# Patient Record
Sex: Female | Born: 1990
Health system: Southern US, Community
[De-identification: ages and names within clinical notes are randomized; demographics above are authoritative.]

## PROBLEM LIST (undated history)

## (undated) DIAGNOSIS — E785 Hyperlipidemia, unspecified: Secondary | ICD-10-CM

## (undated) DIAGNOSIS — K219 Gastro-esophageal reflux disease without esophagitis: Secondary | ICD-10-CM

## (undated) DIAGNOSIS — F32A Depression, unspecified: Secondary | ICD-10-CM

## (undated) DIAGNOSIS — F329 Major depressive disorder, single episode, unspecified: Secondary | ICD-10-CM

## (undated) DIAGNOSIS — G43909 Migraine, unspecified, not intractable, without status migrainosus: Secondary | ICD-10-CM

## (undated) DIAGNOSIS — B019 Varicella without complication: Secondary | ICD-10-CM

## (undated) HISTORY — DX: Gastro-esophageal reflux disease without esophagitis: K21.9

## (undated) HISTORY — DX: Hyperlipidemia, unspecified: E78.5

## (undated) HISTORY — DX: Migraine, unspecified, not intractable, without status migrainosus: G43.909

## (undated) HISTORY — DX: Varicella without complication: B01.9

## (undated) HISTORY — DX: Major depressive disorder, single episode, unspecified: F32.9

## (undated) HISTORY — DX: Depression, unspecified: F32.A

---
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
## 2017-04-25 DIAGNOSIS — R197 Diarrhea, unspecified: Secondary | ICD-10-CM | POA: Diagnosis not present

## 2017-04-25 DIAGNOSIS — F419 Anxiety disorder, unspecified: Secondary | ICD-10-CM | POA: Diagnosis not present

## 2017-04-25 DIAGNOSIS — R1031 Right lower quadrant pain: Secondary | ICD-10-CM | POA: Diagnosis not present

## 2017-04-25 DIAGNOSIS — R1033 Periumbilical pain: Secondary | ICD-10-CM | POA: Diagnosis not present

## 2017-04-25 DIAGNOSIS — R630 Anorexia: Secondary | ICD-10-CM | POA: Diagnosis not present

## 2017-04-25 DIAGNOSIS — Z975 Presence of (intrauterine) contraceptive device: Secondary | ICD-10-CM | POA: Diagnosis not present

## 2017-04-25 DIAGNOSIS — R109 Unspecified abdominal pain: Secondary | ICD-10-CM | POA: Diagnosis not present

## 2017-04-25 DIAGNOSIS — R42 Dizziness and giddiness: Secondary | ICD-10-CM | POA: Diagnosis not present

## 2017-04-25 DIAGNOSIS — K219 Gastro-esophageal reflux disease without esophagitis: Secondary | ICD-10-CM | POA: Diagnosis not present

## 2017-04-25 DIAGNOSIS — Z79899 Other long term (current) drug therapy: Secondary | ICD-10-CM | POA: Diagnosis not present

## 2017-04-25 DIAGNOSIS — Z885 Allergy status to narcotic agent status: Secondary | ICD-10-CM | POA: Diagnosis not present

## 2017-04-25 DIAGNOSIS — R1110 Vomiting, unspecified: Secondary | ICD-10-CM | POA: Diagnosis not present

## 2017-04-25 DIAGNOSIS — F329 Major depressive disorder, single episode, unspecified: Secondary | ICD-10-CM | POA: Diagnosis not present

## 2017-04-25 DIAGNOSIS — Z888 Allergy status to other drugs, medicaments and biological substances status: Secondary | ICD-10-CM | POA: Diagnosis not present

## 2017-05-08 DIAGNOSIS — Z3201 Encounter for pregnancy test, result positive: Secondary | ICD-10-CM | POA: Diagnosis not present

## 2017-07-04 DIAGNOSIS — N9089 Other specified noninflammatory disorders of vulva and perineum: Secondary | ICD-10-CM | POA: Diagnosis not present

## 2017-07-04 DIAGNOSIS — N760 Acute vaginitis: Secondary | ICD-10-CM | POA: Diagnosis not present

## 2017-07-04 DIAGNOSIS — N941 Unspecified dyspareunia: Secondary | ICD-10-CM | POA: Diagnosis not present

## 2017-07-04 DIAGNOSIS — Z975 Presence of (intrauterine) contraceptive device: Secondary | ICD-10-CM | POA: Diagnosis not present

## 2017-07-04 DIAGNOSIS — A749 Chlamydial infection, unspecified: Secondary | ICD-10-CM | POA: Diagnosis not present

## 2017-08-23 DIAGNOSIS — J029 Acute pharyngitis, unspecified: Secondary | ICD-10-CM | POA: Diagnosis not present

## 2018-12-09 ENCOUNTER — Ambulatory Visit (INDEPENDENT_AMBULATORY_CARE_PROVIDER_SITE_OTHER): Payer: BLUE CROSS/BLUE SHIELD | Admitting: Family Medicine

## 2018-12-09 ENCOUNTER — Encounter: Payer: Self-pay | Admitting: Family Medicine

## 2018-12-09 VITALS — BP 120/70 | HR 65 | Ht 65.5 in | Wt 133.0 lb

## 2018-12-09 DIAGNOSIS — K580 Irritable bowel syndrome with diarrhea: Secondary | ICD-10-CM | POA: Insufficient documentation

## 2018-12-09 DIAGNOSIS — D352 Benign neoplasm of pituitary gland: Secondary | ICD-10-CM | POA: Diagnosis not present

## 2018-12-09 DIAGNOSIS — K219 Gastro-esophageal reflux disease without esophagitis: Secondary | ICD-10-CM | POA: Insufficient documentation

## 2018-12-09 DIAGNOSIS — E559 Vitamin D deficiency, unspecified: Secondary | ICD-10-CM | POA: Diagnosis not present

## 2018-12-09 DIAGNOSIS — F339 Major depressive disorder, recurrent, unspecified: Secondary | ICD-10-CM

## 2018-12-09 LAB — CBC
HCT: 38.8 % (ref 36.0–46.0)
Hemoglobin: 12.9 g/dL (ref 12.0–15.0)
MCHC: 33.2 g/dL (ref 30.0–36.0)
MCV: 86.9 fl (ref 78.0–100.0)
Platelets: 242 10*3/uL (ref 150.0–400.0)
RBC: 4.46 Mil/uL (ref 3.87–5.11)
RDW: 13.2 % (ref 11.5–15.5)
WBC: 4.9 10*3/uL (ref 4.0–10.5)

## 2018-12-09 LAB — TSH: TSH: 1.63 u[IU]/mL (ref 0.35–4.50)

## 2018-12-09 LAB — COMPREHENSIVE METABOLIC PANEL
ALT: 28 U/L (ref 0–35)
AST: 28 U/L (ref 0–37)
Albumin: 4.8 g/dL (ref 3.5–5.2)
Alkaline Phosphatase: 50 U/L (ref 39–117)
BUN: 8 mg/dL (ref 6–23)
CHLORIDE: 104 meq/L (ref 96–112)
CO2: 26 mEq/L (ref 19–32)
CREATININE: 0.58 mg/dL (ref 0.40–1.20)
Calcium: 9.9 mg/dL (ref 8.4–10.5)
GFR: 134.08 mL/min (ref >60.00)
GLUCOSE: 90 mg/dL (ref 70–99)
Potassium: 4.8 mEq/L (ref 3.5–5.1)
SODIUM: 138 meq/L (ref 135–145)
Total Bilirubin: 0.3 mg/dL (ref 0.2–1.2)
Total Protein: 7.3 g/dL (ref 6.0–8.3)

## 2018-12-09 LAB — VITAMIN D 25 HYDROXY (VIT D DEFICIENCY, FRACTURES): VITD: 19.38 ng/mL — ABNORMAL LOW (ref 30.00–100.00)

## 2018-12-09 MED ORDER — ESOMEPRAZOLE MAGNESIUM 20 MG PO PACK: 20.0000 mg | PACK | Freq: Every day | ORAL | 1 refills | Status: DC

## 2018-12-09 NOTE — Progress Notes (Addendum)
Established Patient Office Visit  Subjective:  Patient ID: Michele Foster, female    DOB: Feb 21, 1991  Age: 28 y.o. MRN: [id number]  CC:  Chief Complaint  Patient presents with  . Establish Care    HPI [name] presents for establishment of care and also to discuss a conglomeration of multiple symptoms.  Patient has a history of reflux that is been treated intermittently with Zantac and then OTC omeprazole.  She often has crampy abdominal pain after meals followed by loose stool.  There is been no blood or pus in her stooling.  She recently moved back to this area from [place].  She is currently living with her mother and sisters.  While in [place] she was told that she had vitamin D deficiency.  She took a supplement for a month.  She has a past medical history of pituitary microadenoma.  She has been lost to follow-up since 2010.  She currently has an IUD in place.  She is done well with this.  Normal Pap and pelvic this past year.  He is not currently sexually active.  She rarely drinks alcohol and does not smoke or use illicit drugs.  History of depression and anxiety in the past.  Past Medical History:  Diagnosis Date  . Chicken pox   . Depression   . GERD (gastroesophageal reflux disease)   . Hyperlipidemia   . Migraine     History reviewed. No pertinent surgical history.  Family History  Problem Relation Age of Onset  . Arthritis Mother   . Depression Mother   . Hearing loss Mother   . Mental illness Mother   . Hyperlipidemia Father   . Hypertension Father   . Depression Sister   . Mental illness Sister   . Arthritis Maternal Grandmother   . Cancer Maternal Grandmother   . Kidney disease Maternal Grandmother   . Hearing loss Maternal Grandfather   . Stroke Maternal Grandfather   . Depression Sister   . Hyperlipidemia Sister   . Mental illness Sister     Social History   Socioeconomic History  . Marital status: Single    Spouse name: Not on file  .  Number of children: Not on file  . Years of education: Not on file  . Highest education level: Not on file  Occupational History  . Not on file  Social Needs  . Financial resource strain: Not on file  . Food insecurity:    Worry: Not on file    Inability: Not on file  . Transportation needs:    Medical: Not on file    Non-medical: Not on file  Tobacco Use  . Smoking status: Never Smoker  . Smokeless tobacco: Never Used  Substance and Sexual Activity  . Alcohol use: Not Currently  . Drug use: Never  . Sexual activity: Yes  Lifestyle  . Physical activity:    Days per week: Not on file    Minutes per session: Not on file  . Stress: Not on file  Relationships  . Social connections:    Talks on phone: Not on file    Gets together: Not on file    Attends religious service: Not on file    Active member of club or organization: Not on file    Attends meetings of clubs or organizations: Not on file    Relationship status: Not on file  . Intimate partner violence:    Fear of current or ex partner: Not on file  Emotionally abused: Not on file    Physically abused: Not on file    Forced sexual activity: Not on file  Other Topics Concern  . Not on file  Social History Narrative  . Not on file    Outpatient Medications Prior to Visit  Medication Sig Dispense Refill  . omeprazole (PRILOSEC OTC) 20 MG tablet Take 20 mg by mouth daily.     No facility-administered medications prior to visit.     Allergies  Allergen Reactions  . Tramadol Nausea And Vomiting  . Ibuprofen Other (See Comments)    Intolerance.  Gastric upset   . Gabapentin     ROS Review of Systems  Constitutional: Negative for chills, diaphoresis, fatigue, fever and unexpected weight change.  HENT: Negative.   Eyes: Negative for photophobia and visual disturbance.  Respiratory: Negative.   Cardiovascular: Negative.   Gastrointestinal: Negative for anal bleeding, constipation, nausea, rectal pain and  vomiting.  Endocrine: Negative for polyphagia and polyuria.  Genitourinary: Negative.   Musculoskeletal: Negative.   Skin: Negative for rash and wound.  Allergic/Immunologic: Negative for immunocompromised state.  Neurological: Negative for seizures, speech difficulty, numbness and headaches.  Psychiatric/Behavioral: Positive for dysphoric mood. The patient is nervous/anxious.      Depression screen PHQ 2/9 12/09/2018  Decreased Interest 2  Down, Depressed, Hopeless 2  PHQ - 2 Score 4  Altered sleeping 3  Tired, decreased energy 3  Change in appetite 3  Feeling bad or failure about yourself  2  Trouble concentrating 3  Moving slowly or fidgety/restless 2  Suicidal thoughts 0  PHQ-9 Score 20      Objective:    Physical Exam  Constitutional: She is oriented to person, place, and time. She appears well-developed and well-nourished. No distress.  HENT:  Head: Normocephalic and atraumatic.  Right Ear: External ear normal.  Left Ear: External ear normal.  Mouth/Throat: Oropharynx is clear and moist. No oropharyngeal exudate.  Eyes: Pupils are equal, round, and reactive to light. Conjunctivae are normal. Right eye exhibits no discharge. Left eye exhibits no discharge. No scleral icterus.  Neck: Normal range of motion. Neck supple. No JVD present. No tracheal deviation present. No thyromegaly present.  Cardiovascular: Regular rhythm and normal heart sounds.  Pulmonary/Chest: Effort normal and breath sounds normal.  Abdominal: Soft. Bowel sounds are normal. She exhibits no distension. There is no abdominal tenderness. There is no rebound and no guarding.  Lymphadenopathy:    She has no cervical adenopathy.  Neurological: She is alert and oriented to person, place, and time.  Skin: Skin is warm and dry. She is not diaphoretic.  Psychiatric: She has a normal mood and affect. Her behavior is normal.    BP 120/70   Pulse 65   Ht 5' 5.5" (1.664 m)   Wt 133 lb (60.3 kg)   SpO2 97%    BMI 21.80 kg/m  Wt Readings from Last 3 Encounters:  12/09/18 133 lb (60.3 kg)     Health Maintenance Due  Topic Date Due  . HIV Screening  06/08/2008  . INFLUENZA VACCINE  07/04/2018    There are no preventive care reminders to display for this patient.  Lab Results  Component Value Date   TSH 1.63 12/09/2018   Lab Results  Component Value Date   WBC 4.9 12/09/2018   HGB 12.9 12/09/2018   HCT 38.8 12/09/2018   MCV 86.9 12/09/2018   PLT 242.0 12/09/2018   Lab Results  Component Value Date   NA  138 12/09/2018   K 4.8 12/09/2018   CO2 26 12/09/2018   GLUCOSE 90 12/09/2018   BUN 8 12/09/2018   CREATININE 0.58 12/09/2018   BILITOT 0.3 12/09/2018   ALKPHOS 50 12/09/2018   AST 28 12/09/2018   ALT 28 12/09/2018   PROT 7.3 12/09/2018   ALBUMIN 4.8 12/09/2018   CALCIUM 9.9 12/09/2018   GFR 134.08 12/09/2018   No results found for: CHOL No results found for: HDL No results found for: LDLCALC No results found for: TRIG No results found for: CHOLHDL No results found for: HGBA1C    Assessment & Plan:   Problem List Items Addressed This Visit      Digestive   Irritable bowel syndrome with diarrhea - Primary   Relevant Medications   omeprazole (PRILOSEC OTC) 20 MG tablet   esomeprazole (NEXIUM) 20 MG packet   Other Relevant Orders   CBC (Completed)   Comprehensive metabolic panel (Completed)   Gastroesophageal reflux disease   Relevant Medications   omeprazole (PRILOSEC OTC) 20 MG tablet   esomeprazole (NEXIUM) 20 MG packet     Endocrine   Pituitary microadenoma ([place])   Relevant Orders   TSH (Completed)   Prolactin (Completed)   Ambulatory referral to Endocrinology   Cortisol     Other   Vitamin D deficiency   Relevant Orders   VITAMIN D 25 Hydroxy (Vit-D Deficiency, Fractures) (Completed)   Recurrent major depressive disorder ([place])   Relevant Orders   Ambulatory referral to Psychology      Meds ordered this encounter  Medications  .  esomeprazole (NEXIUM) 20 MG packet    Sig: Take 20 mg by mouth daily before breakfast.    Dispense:  90 each    Refill:  1  . DISCONTD: Vitamin D, Ergocalciferol, (DRISDOL) 1.25 MG (50000 UT) CAPS capsule    Sig: Take 1 capsule (50,000 Units total) by mouth every 7 (seven) days.    Dispense:  15 capsule    Refill:  1    Follow-up: Return in about 3 months (around 03/10/2019), or if symptoms worsen or fail to improve.   Patient presents with a remarkably positive PHQ 9.  Advised her to start a medication.  She is adamant today about not using medicine at this time but does agree to pursue counseling.  Will check vitamin D and TSH that TSH levels.  Prolactin follow-up for history of microadenoma.  Will consider endocrinology consultation.

## 2018-12-09 NOTE — Patient Instructions (Signed)
Gastroesophageal Reflux Disease, Adult Gastroesophageal reflux (GER) happens when acid from the stomach flows up into the tube that connects the mouth and the stomach (esophagus). Normally, food travels down the esophagus and stays in the stomach to be digested. With GER, food and stomach acid sometimes move back up into the esophagus. You may have a disease called gastroesophageal reflux disease (GERD) if the reflux:  Happens often.  Causes frequent or very bad symptoms.  Causes problems such as damage to the esophagus. When this happens, the esophagus becomes sore and swollen (inflamed). Over time, GERD can make small holes (ulcers) in the lining of the esophagus. What are the causes? This condition is caused by a problem with the muscle between the esophagus and the stomach. When this muscle is weak or not normal, it does not close properly to keep food and acid from coming back up from the stomach. The muscle can be weak because of:  Tobacco use.  Pregnancy.  Having a certain type of hernia (hiatal hernia).  Alcohol use.  Certain foods and drinks, such as coffee, chocolate, onions, and peppermint. What increases the risk? You are more likely to develop this condition if you:  Are overweight.  Have a disease that affects your connective tissue.  Use NSAID medicines. What are the signs or symptoms? Symptoms of this condition include:  Heartburn.  Difficult or painful swallowing.  The feeling of having a lump in the throat.  A bitter taste in the mouth.  Bad breath.  Having a lot of saliva.  Having an upset or bloated stomach.  Belching.  Chest pain. Different conditions can cause chest pain. Make sure you see your doctor if you have chest pain.  Shortness of breath or noisy breathing (wheezing).  Ongoing (chronic) cough or a cough at night.  Wearing away of the surface of teeth (tooth enamel).  Weight loss. How is this treated? Treatment will depend on how  bad your symptoms are. Your doctor may suggest:  Changes to your diet.  Medicine.  Surgery. Follow these instructions at home: Eating and drinking   Follow a diet as told by your doctor. You may need to avoid foods and drinks such as: ? Coffee and tea (with or without caffeine). ? Drinks that contain alcohol. ? Energy drinks and sports drinks. ? Bubbly (carbonated) drinks or sodas. ? Chocolate and cocoa. ? Peppermint and mint flavorings. ? Garlic and onions. ? Horseradish. ? Spicy and acidic foods. These include peppers, chili powder, curry powder, vinegar, hot sauces, and BBQ sauce. ? Citrus fruit juices and citrus fruits, such as oranges, lemons, and limes. ? Tomato-based foods. These include red sauce, chili, salsa, and pizza with red sauce. ? Fried and fatty foods. These include donuts, french fries, potato chips, and high-fat dressings. ? High-fat meats. These include hot dogs, rib eye steak, sausage, ham, and bacon. ? High-fat dairy items, such as whole milk, butter, and cream cheese.  Eat small meals often. Avoid eating large meals.  Avoid drinking large amounts of liquid with your meals.  Avoid eating meals during the 2-3 hours before bedtime.  Avoid lying down right after you eat.  Do not exercise right after you eat. Lifestyle   Do not use any products that contain nicotine or tobacco. These include cigarettes, e-cigarettes, and chewing tobacco. If you need help quitting, ask your doctor.  Try to lower your stress. If you need help doing this, ask your doctor.  If you are overweight, lose an amount   of weight that is healthy for you. Ask your doctor about a safe weight loss goal. General instructions  Pay attention to any changes in your symptoms.  Take over-the-counter and prescription medicines only as told by your doctor. Do not take aspirin, ibuprofen, or other NSAIDs unless your doctor says it is okay.  Wear loose clothes. Do not wear anything tight  around your waist.  Raise (elevate) the head of your bed about 6 inches (15 cm).  Avoid bending over if this makes your symptoms worse.  Keep all follow-up visits as told by your doctor. This is important. Contact a doctor if:  You have new symptoms.  You lose weight and you do not know why.  You have trouble swallowing or it hurts to swallow.  You have wheezing or a cough that keeps happening.  Your symptoms do not get better with treatment.  You have a hoarse voice. Get help right away if:  You have pain in your arms, neck, jaw, teeth, or back.  You feel sweaty, dizzy, or light-headed.  You have chest pain or shortness of breath.  You throw up (vomit) and your throw-up looks like blood or coffee grounds.  You pass out (faint).  Your poop (stool) is bloody or black.  You cannot swallow, drink, or eat. Summary  If a person has gastroesophageal reflux disease (GERD), food and stomach acid move back up into the esophagus and cause symptoms or problems such as damage to the esophagus.  Treatment will depend on how bad your symptoms are.  Follow a diet as told by your doctor.  Take all medicines only as told by your doctor. This information is not intended to replace advice given to you by your health care provider. Make sure you discuss any questions you have with your health care provider. Document Released: 05/08/2008 Document Revised: 05/29/2018 Document Reviewed: 05/29/2018 Elsevier Interactive Patient Education  2019 [employer].  Major Depressive Disorder, Adult Major depressive disorder (MDD) is a mental health condition. It may also be called clinical depression or unipolar depression. MDD usually causes feelings of sadness, hopelessness, or helplessness. MDD can also cause physical symptoms. It can interfere with work, school, relationships, and other everyday activities. MDD may be mild, moderate, or severe. It may occur once (single episode major depressive  disorder) or it may occur multiple times (recurrent major depressive disorder). What are the causes? The exact cause of this condition is not known. MDD is most likely caused by a combination of things, which may include:  Genetic factors. These are traits that are passed along from parent to child.  Individual factors. Your personality, your behavior, and the way you handle your thoughts and feelings may contribute to MDD. This includes personality traits and behaviors learned from others.  Physical factors, such as: ? Differences in the part of your brain that controls emotion. This part of your brain may be different than it is in people who do not have MDD. ? Long-term (chronic) medical or psychiatric illnesses.  Social factors. Traumatic experiences or major life changes may play a role in the development of MDD. What increases the risk? This condition is more likely to develop in women. The following factors may also make you more likely to develop MDD:  A family history of depression.  Troubled family relationships.  Abnormally low levels of certain brain chemicals.  Traumatic events in childhood, especially abuse or the loss of a parent.  Being under a lot of stress, or long-term  stress, especially from upsetting life experiences or losses.  A history of: ? Chronic physical illness. ? Other mental health disorders. ? Substance abuse.  Poor living conditions.  Experiencing social exclusion or discrimination on a regular basis. What are the signs or symptoms? The main symptoms of MDD typically include:  Constant depressed or irritable mood.  Loss of interest in things and activities. MDD symptoms may also include:  Sleeping or eating too much or too little.  Unexplained weight change.  Fatigue or low energy.  Feelings of worthlessness or guilt.  Difficulty thinking clearly or making decisions.  Thoughts of suicide or of harming others.  Physical agitation  or weakness.  Isolation. Severe cases of MDD may also occur with other symptoms, such as:  Delusions or hallucinations, in which you imagine things that are not real (psychotic depression).  Low-level depression that lasts at least a year (chronic depression or persistent depressive disorder).  Extreme sadness and hopelessness (melancholic depression).  Trouble speaking and moving (catatonic depression). How is this diagnosed? This condition may be diagnosed based on:  Your symptoms.  Your medical history, including your mental health history. This may involve tests to evaluate your mental health. You may be asked questions about your lifestyle, including any drug and alcohol use, and how long you have had symptoms of MDD.  A physical exam.  Blood tests to rule out other conditions. You must have a depressed mood and at least four other MDD symptoms most of the day, nearly every day in the same 2-week timeframe before your health care provider can confirm a diagnosis of MDD. How is this treated? This condition is usually treated by mental health professionals, such as psychologists, psychiatrists, and clinical social workers. You may need more than one type of treatment. Treatment may include:  Psychotherapy. This is also called talk therapy or counseling. Types of psychotherapy include: ? Cognitive behavioral therapy (CBT). This type of therapy teaches you to recognize unhealthy feelings, thoughts, and behaviors, and replace them with positive thoughts and actions. ? Interpersonal therapy (IPT). This helps you to improve the way you relate to and communicate with others. ? Family therapy. This treatment includes members of your family.  Medicine to treat anxiety and depression, or to help you control certain emotions and behaviors.  Lifestyle changes, such as: ? Limiting alcohol and drug use. ? Exercising regularly. ? Getting plenty of sleep. ? Making healthy eating  choices. ? Spending more time outdoors.  Treatments involving stimulation of the brain can be used in situations with extremely severe symptoms, or when medicine or other therapies do not work over time. These treatments include electroconvulsive therapy, transcranial magnetic stimulation, and vagal nerve stimulation. Follow these instructions at home: Activity  Return to your normal activities as told by your health care provider.  Exercise regularly and spend time outdoors as told by your health care provider. General instructions  Take over-the-counter and prescription medicines only as told by your health care provider.  Do not drink alcohol. If you drink alcohol, limit your alcohol intake to no more than 1 drink a day for nonpregnant women and 2 drinks a day for men. One drink equals 12 oz of beer, 5 oz of wine, or 1 oz of hard liquor. Alcohol can affect any antidepressant medicines you are taking. Talk to your health care provider about your alcohol use.  Eat a healthy diet and get plenty of sleep.  Find activities that you enjoy doing, and make time to do  them.  Consider joining a support group. Your health care provider may be able to recommend a support group.  Keep all follow-up visits as told by your health care provider. This is important. Where to find more information [employer] on Mental Illness  www.nami.org [place] National Institute of Mental Health  https://carter.com/ National Suicide Prevention Lifeline  1-800-273-TALK (872)302-9564). This is free, 24-hour help. Contact a health care provider if:  Your symptoms get worse.  You develop new symptoms. Get help right away if:  You self-harm.  You have serious thoughts about hurting yourself or others.  You see, hear, taste, smell, or feel things that are not present (hallucinate). This information is not intended to replace advice given to you by your health care provider. Make sure you discuss any questions  you have with your health care provider. Document Released: 03/17/2013 Document Revised: 07/27/2016 Document Reviewed: 05/31/2016 Elsevier Interactive Patient Education  2019 [employer].

## 2018-12-10 ENCOUNTER — Other Ambulatory Visit: Payer: Self-pay

## 2018-12-10 DIAGNOSIS — E559 Vitamin D deficiency, unspecified: Secondary | ICD-10-CM

## 2018-12-10 LAB — PROLACTIN: Prolactin: 37.7 ng/mL — ABNORMAL HIGH

## 2018-12-10 MED ORDER — VITAMIN D (ERGOCALCIFEROL) 1.25 MG (50000 UNIT) PO CAPS: 50000.0000 [IU] | ORAL_CAPSULE | ORAL | 1 refills | Status: DC

## 2018-12-10 MED ORDER — VITAMIN D (ERGOCALCIFEROL) 1.25 MG (50000 UNIT) PO CAPS: 50000.0000 [IU] | ORAL_CAPSULE | ORAL | 1 refills | Status: AC

## 2018-12-10 NOTE — Addendum Note (Signed)
Addended by: Jon Billings on: 12/10/2018 08:14 AM   Modules accepted: Orders

## 2018-12-10 NOTE — Addendum Note (Signed)
Addended by: Abelino Derrick A on: 12/10/2018 01:03 PM   Modules accepted: Orders

## 2018-12-13 ENCOUNTER — Ambulatory Visit (INDEPENDENT_AMBULATORY_CARE_PROVIDER_SITE_OTHER): Payer: BLUE CROSS/BLUE SHIELD | Admitting: Psychology

## 2018-12-13 DIAGNOSIS — F332 Major depressive disorder, recurrent severe without psychotic features: Secondary | ICD-10-CM | POA: Diagnosis not present

## 2018-12-24 ENCOUNTER — Ambulatory Visit (INDEPENDENT_AMBULATORY_CARE_PROVIDER_SITE_OTHER): Payer: BLUE CROSS/BLUE SHIELD | Admitting: Psychology

## 2018-12-24 DIAGNOSIS — F332 Major depressive disorder, recurrent severe without psychotic features: Secondary | ICD-10-CM

## 2018-12-24 DIAGNOSIS — F411 Generalized anxiety disorder: Secondary | ICD-10-CM

## 2018-12-26 ENCOUNTER — Ambulatory Visit (INDEPENDENT_AMBULATORY_CARE_PROVIDER_SITE_OTHER): Payer: BLUE CROSS/BLUE SHIELD | Admitting: Family Medicine

## 2018-12-26 ENCOUNTER — Encounter: Payer: Self-pay | Admitting: Family Medicine

## 2018-12-26 VITALS — BP 110/70 | HR 99 | Ht 65.5 in

## 2018-12-26 DIAGNOSIS — F339 Major depressive disorder, recurrent, unspecified: Secondary | ICD-10-CM | POA: Diagnosis not present

## 2018-12-26 DIAGNOSIS — D352 Benign neoplasm of pituitary gland: Secondary | ICD-10-CM | POA: Diagnosis not present

## 2018-12-26 DIAGNOSIS — E559 Vitamin D deficiency, unspecified: Secondary | ICD-10-CM

## 2018-12-26 MED ORDER — SERTRALINE HCL 25 MG PO TABS: 25.0000 mg | ORAL_TABLET | Freq: Every day | ORAL | 1 refills | Status: DC

## 2018-12-26 NOTE — Progress Notes (Signed)
Established Patient Office Visit  Subjective:  Patient ID: Michele Foster, female    DOB: 03-Jul-1991  Age: 28 y.o. MRN: [id number]  CC:  Chief Complaint  Patient presents with  . Follow-up    HPI Michele Foster presents for follow-up of her vitamin D deficiency.  She is taking ergocalciferol weekly without issue.  She is currently seeing a therapist for talk therapy.  Patient is now agreeable to trying an antidepressant after talking to her therapist.  She has taken Zoloft in the past.  Tells me that the doses pushed to higher levels.  She had felt numb with the drug and does agree to starting it at a low dose.  She has an appointment with her endocrinologist next Wednesday.  She requests a letter for therapy dog to live with her in her apartment.  She has been looking for work in a [employer] applying to 3-4 offices a day and working with a temp agency.  Past Medical History:  Diagnosis Date  . Chicken pox   . Depression   . GERD (gastroesophageal reflux disease)   . Hyperlipidemia   . Migraine     History reviewed. No pertinent surgical history.  Family History  Problem Relation Age of Onset  . Arthritis Mother   . Depression Mother   . Hearing loss Mother   . Mental illness Mother   . Hyperlipidemia Father   . Hypertension Father   . Depression Sister   . Mental illness Sister   . Arthritis Maternal Grandmother   . Cancer Maternal Grandmother   . Kidney disease Maternal Grandmother   . Hearing loss Maternal Grandfather   . Stroke Maternal Grandfather   . Depression Sister   . Hyperlipidemia Sister   . Mental illness Sister     Social History   Socioeconomic History  . Marital status: Single    Spouse name: Not on file  . Number of children: Not on file  . Years of education: Not on file  . Highest education level: Not on file  Occupational History  . Not on file  Social Needs  . Financial resource strain: Not on file  . Food insecurity:    Worry: Not on  file    Inability: Not on file  . Transportation needs:    Medical: Not on file    Non-medical: Not on file  Tobacco Use  . Smoking status: Never Smoker  . Smokeless tobacco: Never Used  Substance and Sexual Activity  . Alcohol use: Not Currently  . Drug use: Never  . Sexual activity: Yes  Lifestyle  . Physical activity:    Days per week: Not on file    Minutes per session: Not on file  . Stress: Not on file  Relationships  . Social connections:    Talks on phone: Not on file    Gets together: Not on file    Attends religious service: Not on file    Active member of club or organization: Not on file    Attends meetings of clubs or organizations: Not on file    Relationship status: Not on file  . Intimate partner violence:    Fear of current or ex partner: Not on file    Emotionally abused: Not on file    Physically abused: Not on file    Forced sexual activity: Not on file  Other Topics Concern  . Not on file  Social History Narrative  . Not on file  Outpatient Medications Prior to Visit  Medication Sig Dispense Refill  . esomeprazole (NEXIUM) 20 MG packet Take 20 mg by mouth daily before breakfast. 90 each 1  . omeprazole (PRILOSEC OTC) 20 MG tablet Take 20 mg by mouth daily.    . Vitamin D, Ergocalciferol, (DRISDOL) 1.25 MG (50000 UT) CAPS capsule Take 1 capsule (50,000 Units total) by mouth every 7 (seven) days. 15 capsule 1   No facility-administered medications prior to visit.     Allergies  Allergen Reactions  . Tramadol Nausea And Vomiting  . Ibuprofen Other (See Comments)    Intolerance.  Gastric upset   . Gabapentin     ROS Review of Systems  Constitutional: Negative.   Respiratory: Negative.   Cardiovascular: Negative.   Gastrointestinal: Negative.   Psychiatric/Behavioral: Positive for dysphoric mood. Negative for self-injury and suicidal ideas.      Objective:    Physical Exam  Constitutional: She is oriented to person, place, and time.  She appears well-developed and well-nourished. No distress.  HENT:  Head: Normocephalic and atraumatic.  Right Ear: External ear normal.  Left Ear: External ear normal.  Eyes: Conjunctivae are normal. Right eye exhibits no discharge. Left eye exhibits no discharge. No scleral icterus.  Pulmonary/Chest: Effort normal.  Neurological: She is alert and oriented to person, place, and time.  Skin: Skin is warm and dry. She is not diaphoretic.  Psychiatric: She has a normal mood and affect. Her behavior is normal.    BP 110/70   Pulse 99   Ht 5' 5.5" (1.664 m)   SpO2 99%   BMI 21.80 kg/m  Wt Readings from Last 3 Encounters:  12/09/18 133 lb (60.3 kg)   BP Readings from Last 3 Encounters:  12/26/18 110/70  12/09/18 120/70   Guideline developer:  UpToDate (see UpToDate for funding source) Date Released: June 2014  Health Maintenance Due  Topic Date Due  . HIV Screening  06/08/2008  . INFLUENZA VACCINE  07/04/2018    There are no preventive care reminders to display for this patient.  Lab Results  Component Value Date   TSH 1.63 12/09/2018   Lab Results  Component Value Date   WBC 4.9 12/09/2018   HGB 12.9 12/09/2018   HCT 38.8 12/09/2018   MCV 86.9 12/09/2018   PLT 242.0 12/09/2018   Lab Results  Component Value Date   NA 138 12/09/2018   K 4.8 12/09/2018   CO2 26 12/09/2018   GLUCOSE 90 12/09/2018   BUN 8 12/09/2018   CREATININE 0.58 12/09/2018   BILITOT 0.3 12/09/2018   ALKPHOS 50 12/09/2018   AST 28 12/09/2018   ALT 28 12/09/2018   PROT 7.3 12/09/2018   ALBUMIN 4.8 12/09/2018   CALCIUM 9.9 12/09/2018   GFR 134.08 12/09/2018   No results found for: CHOL No results found for: HDL No results found for: LDLCALC No results found for: TRIG No results found for: CHOLHDL No results found for: HGBA1C    Assessment & Plan:   Problem List Items Addressed This Visit      Endocrine   Pituitary microadenoma ([place]) - Primary     Other   Vitamin D deficiency     Recurrent major depressive disorder ([place])   Relevant Medications   sertraline (ZOLOFT) 25 MG tablet   Other Relevant Orders   Ambulatory referral to Psychiatry      Meds ordered this encounter  Medications  . sertraline (ZOLOFT) 25 MG tablet    Sig: Take 1  tablet (25 mg total) by mouth daily.    Dispense:  30 tablet    Refill:  1    Follow-up: Return in about 5 weeks (around 01/30/2019).   Patient will return one morning for a cortisol level.  She will follow-up with me or a psychiatrist in 4 to 6 weeks.

## 2019-01-01 ENCOUNTER — Ambulatory Visit: Payer: BLUE CROSS/BLUE SHIELD | Admitting: Endocrinology

## 2019-01-01 ENCOUNTER — Encounter: Payer: Self-pay | Admitting: Endocrinology

## 2019-01-01 VITALS — BP 110/72 | HR 84 | Ht 65.5 in | Wt 136.8 lb

## 2019-01-01 DIAGNOSIS — R634 Abnormal weight loss: Secondary | ICD-10-CM

## 2019-01-01 DIAGNOSIS — D352 Benign neoplasm of pituitary gland: Secondary | ICD-10-CM

## 2019-01-01 DIAGNOSIS — R5383 Other fatigue: Secondary | ICD-10-CM | POA: Diagnosis not present

## 2019-01-01 NOTE — Progress Notes (Signed)
Patient ID: Michele Foster, female   DOB: 09/23/1991, 28 y.o.   MRN: [id number]             Referring PCP: [name]  Chief complaint: Weight loss  History of Present Illness   She was apparently evaluated for milky breast discharge when she was in high school along with delayed or missed menstrual cycles. She was told that her prolactin level was high and she also had a 5 mm pituitary microadenoma on MRI She was treated with unknown medication which she thinks she took for less than a year No further details are available and she does not remember her response to the medication also  She has continued to have irregular menstrual cycle but since she had an IUD about 5 years ago she does not have menstrual cycles now She does not complain of any  milky breast discharge noticeable at this time  Prolactin levels:  Lab Results  Component Value Date   PROLACTIN 37.7 (H) 12/09/2018     MRI of pituitary gland done in shows  Several images suggest a 5 mm T2 hypointense, hypoenhancing lesion in the left aspect of the sella which is suspicious for a pituitary microadenoma  OTHER problems: Patient has several other symptoms going on She thinks that for the last 2 months or so he has had a significant decrease in her appetite along with nausea She has lost about 20 pounds Currently she is trying to use protein shakes or eat in between meals to maintain her nutrition She does not exercise excessively and only 3 to 4 days a week with mostly toning and some limited cardio  She has been feeling much more tired but also has difficulty sleep Although she is having some issues with anxiety and depression she does not think this is severe and is just going to start Zoloft which she has taken before She is also concerned about her hair loss, tendency to easy bruising and some darkening of some areas of her arms or legs She may tend to get lightheaded when getting up quickly   Wt Readings from Last  3 Encounters:  01/01/19 136 lb 12.8 oz (62.1 kg)  12/09/18 133 lb (60.3 kg)     Allergies as of 01/01/2019      Reactions   Tramadol Nausea And Vomiting   Ibuprofen Other (See Comments)   Intolerance.  Gastric upset   Gabapentin       Medication List       Accurate as of January 01, 2019  2:20 PM. Always use your most recent med list.        esomeprazole 20 MG packet Commonly known as:  NEXIUM Take 20 mg by mouth daily before breakfast.   sertraline 25 MG tablet Commonly known as:  ZOLOFT Take 1 tablet (25 mg total) by mouth daily.   Vitamin D (Ergocalciferol) 1.25 MG (50000 UT) Caps capsule Commonly known as:  DRISDOL Take 1 capsule (50,000 Units total) by mouth every 7 (seven) days.       Allergies:  Allergies  Allergen Reactions  . Tramadol Nausea And Vomiting  . Ibuprofen Other (See Comments)    Intolerance.  Gastric upset   . Gabapentin     Past Medical History:  Diagnosis Date  . Chicken pox   . Depression   . GERD (gastroesophageal reflux disease)   . Hyperlipidemia   . Migraine     History reviewed. No pertinent surgical history.  Family History  Problem  Relation Age of Onset  . Arthritis Mother   . Depression Mother   . Hearing loss Mother   . Mental illness Mother   . Hyperlipidemia Father   . Hypertension Father   . Depression Sister   . Mental illness Sister   . Arthritis Maternal Grandmother   . Cancer Maternal Grandmother   . Kidney disease Maternal Grandmother   . Hearing loss Maternal Grandfather   . Stroke Maternal Grandfather   . Depression Sister   . Hyperlipidemia Sister   . Mental illness Sister     Social History:  reports that she has never smoked. She has never used smokeless tobacco. She reports previous alcohol use. She reports that she does not use drugs.   Review of Systems  Constitutional: Positive for weight loss and reduced appetite.       Weight loss since about 11/19  HENT: Positive for headaches.         She thinks she gets migraines with some visual changes  Eyes: Positive for blurred vision.       Occasional blurred vision  Respiratory: Negative for daytime sleepiness.   Gastrointestinal: Positive for nausea, diarrhea and abdominal pain.       Occasional vomiting, has frequent nausea Her bowel movements are more frequent and softer, mostly after meals  Endocrine: Negative for menstrual changes.  Genitourinary: Negative for frequency.  Skin:       Has had more hair loss over the last 2 months  Neurological: Negative for numbness.  Psychiatric/Behavioral: Positive for depressed mood and insomnia.    EXAM:  BP 110/72 (BP Location: Left Arm, Patient Position: Standing, Cuff Size: Normal)   Pulse 84   Ht 5' 5.5" (1.664 m)   Wt 136 lb 12.8 oz (62.1 kg)   SpO2 96%   BMI 22.42 kg/m   Repeat standing blood pressure 120/72  Physical Exam HENT:     Mouth/Throat:     Mouth: Mucous membranes are moist.     Pharynx: Oropharynx is clear.     Comments: No oral pigmentation seen Eyes:     Conjunctiva/sclera: Conjunctivae normal.     Comments: Fundus exam shows normal discs  Neck:     Thyroid: No thyromegaly.  Cardiovascular:     Rate and Rhythm: Normal rate and regular rhythm.     Heart sounds: Normal heart sounds. No murmur.  Pulmonary:     Breath sounds: Normal breath sounds. No wheezing or rales.  Abdominal:     General: There is no distension.     Palpations: Abdomen is soft. There is no mass.     Comments: Mild tenderness in the right lower quadrant area  Musculoskeletal:        General: No swelling.     Right lower leg: No edema.     Left lower leg: No edema.  Lymphadenopathy:     Cervical: No cervical adenopathy.  Skin:    General: Skin is dry.     Coloration: Skin is not pale.     Comments: Minimal irregular darkening of back of the forearms and lower legs but no pigmentation of bony prominences or scar on the left knee      ASSESSMENT:     Prolactinoma with  current prolactin level of about 38 and minimal symptoms She has a long history and has been inadequately treated, probably resulting in estrogen deficiency and discussed implications of prolactinoma Difficult to assess her menstrual cycles because of having IUD  WEIGHT loss,  fatigue and nonspecific GI symptoms with orthostatic lightheadedness, will need to rule out adrenal insufficiency Objectively does not have orthostatic hypotension or history of hyponatremia  Nonspecific fatigue, increased sweating, hair loss  History of depression, relatively mild along with insomnia, currently planning to start Zoloft  Also history of vitamin D deficiency treated by PCP    PLAN:     Will consider starting cabergoline after evaluation of pituitary function  Also will first need to assess her pituitary function with Cortrosyn stimulation test, free T4 and IGF-1 level If these tests are normal may not need pituitary MRI Also may need to follow-up with PCP regarding her symptoms of weight loss and nausea  Copy of the consultation has been sent to the referring PCP  Elayne Snare 01/01/19   Note: This office note was prepared with Dragon voice recognition system technology. Any transcriptional errors that result from this process are unintentional.

## 2019-01-03 ENCOUNTER — Other Ambulatory Visit: Payer: Self-pay

## 2019-01-07 ENCOUNTER — Other Ambulatory Visit (INDEPENDENT_AMBULATORY_CARE_PROVIDER_SITE_OTHER): Payer: BLUE CROSS/BLUE SHIELD

## 2019-01-07 ENCOUNTER — Ambulatory Visit (INDEPENDENT_AMBULATORY_CARE_PROVIDER_SITE_OTHER): Payer: BLUE CROSS/BLUE SHIELD

## 2019-01-07 DIAGNOSIS — R5383 Other fatigue: Secondary | ICD-10-CM | POA: Diagnosis not present

## 2019-01-07 DIAGNOSIS — R634 Abnormal weight loss: Secondary | ICD-10-CM

## 2019-01-07 DIAGNOSIS — D352 Benign neoplasm of pituitary gland: Secondary | ICD-10-CM | POA: Diagnosis not present

## 2019-01-07 LAB — CORTISOL
Cortisol, Plasma: 14.1 ug/dL
Cortisol, Plasma: 20.5 ug/dL

## 2019-01-07 LAB — T4, FREE: Free T4: 0.87 ng/dL (ref 0.60–1.60)

## 2019-01-07 MED ORDER — COSYNTROPIN 0.25 MG IJ SOLR: 0.2500 mg | Freq: Once | INTRAMUSCULAR | Status: DC

## 2019-01-07 MED ORDER — COSYNTROPIN 0.25 MG IJ SOLR
0.2500 mg | Freq: Once | INTRAMUSCULAR | Status: AC
  Administered 2019-01-07: 0.25 mg via INTRAMUSCULAR

## 2019-01-07 NOTE — Progress Notes (Addendum)
Per orders of Dr. Dwyane Dee injection of cosyntropin 0.25mg  given today by N.Elesia Pemberton,LPN . Patient tolerated injection well. Medicatoin was given in left deltoid muscle.

## 2019-01-08 LAB — INSULIN-LIKE GROWTH FACTOR: Insulin-Like GF-1: 230 ng/mL (ref 93–342)

## 2019-01-09 ENCOUNTER — Other Ambulatory Visit: Payer: Self-pay

## 2019-01-09 MED ORDER — CABERGOLINE 0.5 MG PO TABS: ORAL_TABLET | ORAL | 2 refills | Status: DC

## 2019-01-10 ENCOUNTER — Ambulatory Visit (INDEPENDENT_AMBULATORY_CARE_PROVIDER_SITE_OTHER): Payer: BLUE CROSS/BLUE SHIELD | Admitting: Psychology

## 2019-01-10 DIAGNOSIS — F332 Major depressive disorder, recurrent severe without psychotic features: Secondary | ICD-10-CM | POA: Diagnosis not present

## 2019-01-10 DIAGNOSIS — F411 Generalized anxiety disorder: Secondary | ICD-10-CM

## 2019-01-15 ENCOUNTER — Ambulatory Visit (INDEPENDENT_AMBULATORY_CARE_PROVIDER_SITE_OTHER): Payer: BLUE CROSS/BLUE SHIELD | Admitting: Psychology

## 2019-01-15 DIAGNOSIS — F332 Major depressive disorder, recurrent severe without psychotic features: Secondary | ICD-10-CM

## 2019-01-15 DIAGNOSIS — F411 Generalized anxiety disorder: Secondary | ICD-10-CM | POA: Diagnosis not present

## 2019-01-16 ENCOUNTER — Encounter: Payer: Self-pay | Admitting: Family Medicine

## 2019-01-16 ENCOUNTER — Telehealth: Payer: Self-pay | Admitting: Family Medicine

## 2019-01-16 ENCOUNTER — Ambulatory Visit: Payer: BLUE CROSS/BLUE SHIELD | Admitting: Family Medicine

## 2019-01-16 VITALS — BP 110/60 | HR 88 | Ht 65.5 in | Wt 138.4 lb

## 2019-01-16 DIAGNOSIS — F339 Major depressive disorder, recurrent, unspecified: Secondary | ICD-10-CM | POA: Diagnosis not present

## 2019-01-16 DIAGNOSIS — E559 Vitamin D deficiency, unspecified: Secondary | ICD-10-CM | POA: Diagnosis not present

## 2019-01-16 DIAGNOSIS — L709 Acne, unspecified: Secondary | ICD-10-CM | POA: Insufficient documentation

## 2019-01-16 NOTE — Telephone Encounter (Signed)
Patient is requesting to transfer from Dr. [name] to Dr. Celso Amy. Please contact pt at [phone] to advise.

## 2019-01-16 NOTE — Telephone Encounter (Signed)
I am not currently accepting new patients.  We have two NPs in our office that are accepting new patients.

## 2019-01-16 NOTE — Progress Notes (Signed)
Established Patient Office Visit  Subjective:  Patient ID: Michele Foster, female    DOB: 10-01-1991  Age: 28 y.o. MRN: [id number]  CC:  Chief Complaint  Patient presents with  . Follow-up    HPI Michele Foster presents for follow-up of her depression.  She only started the Zoloft a few weeks ago but feels as though there is been some improvement.  She has not heard about a psychiatry referral but continues in counseling once weekly.  She is definitely interested in seeing psychiatry for follow-up of her ADHD as well.  She asked for dermatology referral for acne blemishes.  She is starting a new job on Monday.  She is seeing endocrinology for her pituitary microadenoma.   Past Medical History:  Diagnosis Date  . Chicken pox   . Depression   . GERD (gastroesophageal reflux disease)   . Hyperlipidemia   . Migraine     History reviewed. No pertinent surgical history.  Family History  Problem Relation Age of Onset  . Arthritis Mother   . Depression Mother   . Hearing loss Mother   . Mental illness Mother   . Hyperlipidemia Father   . Hypertension Father   . Depression Sister   . Mental illness Sister   . Arthritis Maternal Grandmother   . Cancer Maternal Grandmother   . Kidney disease Maternal Grandmother   . Hearing loss Maternal Grandfather   . Stroke Maternal Grandfather   . Depression Sister   . Hyperlipidemia Sister   . Mental illness Sister     Social History   Socioeconomic History  . Marital status: Single    Spouse name: Not on file  . Number of children: Not on file  . Years of education: Not on file  . Highest education level: Not on file  Occupational History  . Not on file  Social Needs  . Financial resource strain: Not on file  . Food insecurity:    Worry: Not on file    Inability: Not on file  . Transportation needs:    Medical: Not on file    Non-medical: Not on file  Tobacco Use  . Smoking status: Never Smoker  . Smokeless tobacco: Never  Used  Substance and Sexual Activity  . Alcohol use: Not Currently  . Drug use: Never  . Sexual activity: Yes  Lifestyle  . Physical activity:    Days per week: Not on file    Minutes per session: Not on file  . Stress: Not on file  Relationships  . Social connections:    Talks on phone: Not on file    Gets together: Not on file    Attends religious service: Not on file    Active member of club or organization: Not on file    Attends meetings of clubs or organizations: Not on file    Relationship status: Not on file  . Intimate partner violence:    Fear of current or ex partner: Not on file    Emotionally abused: Not on file    Physically abused: Not on file    Forced sexual activity: Not on file  Other Topics Concern  . Not on file  Social History Narrative  . Not on file    Outpatient Medications Prior to Visit  Medication Sig Dispense Refill  . cabergoline (DOSTINEX) 0.5 MG tablet Take 1/2 tablet twice weekly 12 tablet 2  . esomeprazole (NEXIUM) 20 MG packet Take 20 mg by mouth daily before breakfast.  90 each 1  . sertraline (ZOLOFT) 25 MG tablet Take 1 tablet (25 mg total) by mouth daily. 30 tablet 1  . Vitamin D, Ergocalciferol, (DRISDOL) 1.25 MG (50000 UT) CAPS capsule Take 1 capsule (50,000 Units total) by mouth every 7 (seven) days. 15 capsule 1   No facility-administered medications prior to visit.     Allergies  Allergen Reactions  . Tramadol Nausea And Vomiting  . Ibuprofen Other (See Comments)    Intolerance.  Gastric upset   . Gabapentin     ROS Review of Systems  Constitutional: Negative.   Respiratory: Negative.   Cardiovascular: Negative.   Skin: Positive for color change and rash.  Neurological: Negative for seizures, speech difficulty and headaches.  Psychiatric/Behavioral: Positive for dysphoric mood and sleep disturbance. The patient is nervous/anxious.    Depression screen [facility] 2/9 01/16/2019 12/09/2018  Decreased Interest 1 2  Down,  Depressed, Hopeless 1 2  PHQ - 2 Score 2 4  Altered sleeping 3 3  Tired, decreased energy 3 3  Change in appetite 1 3  Feeling bad or failure about yourself  1 2  Trouble concentrating 3 3  Moving slowly or fidgety/restless 1 2  Suicidal thoughts 0 0  PHQ-9 Score 14 20      Objective:    Physical Exam  Constitutional: She is oriented to person, place, and time. She appears well-developed and well-nourished. No distress.  HENT:  Head: Normocephalic and atraumatic.  Right Ear: External ear normal.  Left Ear: External ear normal.  Eyes: Right eye exhibits no discharge. Left eye exhibits no discharge. No scleral icterus.  Pulmonary/Chest: Effort normal.  Neurological: She is alert and oriented to person, place, and time.  Skin: Skin is warm and dry. She is not diaphoretic.     Psychiatric: She has a normal mood and affect. Her speech is normal and behavior is normal.  Patient was smiling and engaged with excellent eye contact throughout the office visit.     BP 110/60   Pulse 88   Ht 5' 5.5" (1.664 m)   Wt 138 lb 6 oz (62.8 kg)   BMI 22.68 kg/m  Wt Readings from Last 3 Encounters:  01/16/19 138 lb 6 oz (62.8 kg)  01/01/19 136 lb 12.8 oz (62.1 kg)  12/09/18 133 lb (60.3 kg)   BP Readings from Last 3 Encounters:  01/16/19 110/60  01/01/19 110/72  12/26/18 110/70   Guideline developer:  UpToDate (see UpToDate for funding source) Date Released: June 2014  Health Maintenance Due  Topic Date Due  . HIV Screening  06/08/2008  . INFLUENZA VACCINE  07/04/2018    There are no preventive care reminders to display for this patient.  Lab Results  Component Value Date   TSH 1.63 12/09/2018   Lab Results  Component Value Date   WBC 4.9 12/09/2018   HGB 12.9 12/09/2018   HCT 38.8 12/09/2018   MCV 86.9 12/09/2018   PLT 242.0 12/09/2018   Lab Results  Component Value Date   NA 138 12/09/2018   K 4.8 12/09/2018   CO2 26 12/09/2018   GLUCOSE 90 12/09/2018   BUN 8  12/09/2018   CREATININE 0.58 12/09/2018   BILITOT 0.3 12/09/2018   ALKPHOS 50 12/09/2018   AST 28 12/09/2018   ALT 28 12/09/2018   PROT 7.3 12/09/2018   ALBUMIN 4.8 12/09/2018   CALCIUM 9.9 12/09/2018   GFR 134.08 12/09/2018   No results found for: CHOL No results found for: HDL  No results found for: LDLCALC No results found for: TRIG No results found for: CHOLHDL No results found for: HGBA1C    Assessment & Plan:   Problem List Items Addressed This Visit      Musculoskeletal and Integument   Acne   Relevant Orders   Ambulatory referral to Dermatology     Other   Vitamin D deficiency   Recurrent major depressive disorder ([place]) - Primary   Relevant Orders   Ambulatory referral to Psychiatry      No orders of the defined types were placed in this encounter.  Patient will continue counseling and low dose of Zoloft.  She has existing prescription trazodone for sleep as needed.  Psychiatry follow-up is pending.  Referral back pain treatment per her request.  Follow-up: Return in about 3 months (around 04/16/2019), or for recheck of vit d level.Marland Kitchen

## 2019-01-17 DIAGNOSIS — Z113 Encounter for screening for infections with a predominantly sexual mode of transmission: Secondary | ICD-10-CM | POA: Diagnosis not present

## 2019-01-17 DIAGNOSIS — Z30432 Encounter for removal of intrauterine contraceptive device: Secondary | ICD-10-CM | POA: Diagnosis not present

## 2019-01-17 DIAGNOSIS — Z124 Encounter for screening for malignant neoplasm of cervix: Secondary | ICD-10-CM | POA: Diagnosis not present

## 2019-01-17 DIAGNOSIS — Z6821 Body mass index (BMI) 21.0-21.9, adult: Secondary | ICD-10-CM | POA: Diagnosis not present

## 2019-01-17 DIAGNOSIS — Z01419 Encounter for gynecological examination (general) (routine) without abnormal findings: Secondary | ICD-10-CM | POA: Diagnosis not present

## 2019-01-17 NOTE — Telephone Encounter (Signed)
Okay with me 

## 2019-01-17 NOTE — Telephone Encounter (Signed)
I called and left message on patient voicemail message that I received message back and Dr. Quay Burow is currently not accepting new patients, but there are two NPs in the office that are accepting patients.

## 2019-01-20 ENCOUNTER — Ambulatory Visit: Payer: BLUE CROSS/BLUE SHIELD | Admitting: Psychology

## 2019-01-23 ENCOUNTER — Ambulatory Visit: Payer: Self-pay | Admitting: Family Medicine

## 2019-01-23 ENCOUNTER — Other Ambulatory Visit: Payer: Self-pay | Admitting: Family Medicine

## 2019-01-23 DIAGNOSIS — F339 Major depressive disorder, recurrent, unspecified: Secondary | ICD-10-CM

## 2019-01-29 ENCOUNTER — Ambulatory Visit: Payer: BLUE CROSS/BLUE SHIELD | Admitting: Psychology

## 2019-01-30 ENCOUNTER — Other Ambulatory Visit: Payer: Self-pay | Admitting: Endocrinology

## 2019-01-30 DIAGNOSIS — D352 Benign neoplasm of pituitary gland: Secondary | ICD-10-CM

## 2019-02-03 ENCOUNTER — Ambulatory Visit: Payer: BLUE CROSS/BLUE SHIELD | Admitting: Psychology

## 2019-02-04 ENCOUNTER — Other Ambulatory Visit: Payer: Self-pay

## 2019-02-04 ENCOUNTER — Encounter (HOSPITAL_COMMUNITY): Payer: Self-pay | Admitting: Psychiatry

## 2019-02-04 ENCOUNTER — Ambulatory Visit (INDEPENDENT_AMBULATORY_CARE_PROVIDER_SITE_OTHER): Payer: BLUE CROSS/BLUE SHIELD | Admitting: Psychiatry

## 2019-02-04 VITALS — BP 125/64 | HR 98 | Ht 66.0 in | Wt 137.0 lb

## 2019-02-04 DIAGNOSIS — F909 Attention-deficit hyperactivity disorder, unspecified type: Secondary | ICD-10-CM

## 2019-02-04 DIAGNOSIS — F330 Major depressive disorder, recurrent, mild: Secondary | ICD-10-CM | POA: Diagnosis not present

## 2019-02-04 DIAGNOSIS — F422 Mixed obsessional thoughts and acts: Secondary | ICD-10-CM | POA: Diagnosis not present

## 2019-02-04 MED ORDER — FLUVOXAMINE MALEATE 50 MG PO TABS: 50.0000 mg | ORAL_TABLET | Freq: Every day | ORAL | 0 refills | Status: DC

## 2019-02-04 MED ORDER — AMPHETAMINE-DEXTROAMPHETAMINE 10 MG PO TABS: 10.0000 mg | ORAL_TABLET | Freq: Two times a day (BID) | ORAL | 0 refills | Status: DC

## 2019-02-04 MED ORDER — CLONAZEPAM 0.5 MG PO TBDP: 0.5000 mg | ORAL_TABLET | Freq: Two times a day (BID) | ORAL | 0 refills | Status: DC | PRN

## 2019-02-04 NOTE — Patient Instructions (Signed)
Plan:  1. Fluvoxamine (Luvox) 50 mg take one hour before bedtime - it is for sleep and OCD.  2. Trial of Adderall for ADD (tablets are 10 mg):   Days 1-5 - take 1/2 tablet in AM and 1/2 tablet at noon (5 mg each),  Days 6-10 - take one tablet in AM and one at noon,  Days 11-15 - take 1 1/2 tablet in AM and 1 1/2 at noon.   Take them with some food. Please note how long each dose works and what, if any, adverse effects you may experience.  Chose a task you typically struggle with and judge how much easier it becomes once you start taking this medication.

## 2019-02-04 NOTE — Progress Notes (Addendum)
Psychiatric Initial Adult Assessment   Patient Identification: Michele Foster MRN:  [id number] Date of Evaluation:  02/04/2019 Referral Source: Ali Lowe MD Chief Complaint:  Anxiety, inability focus, poor sleep. Visit Diagnosis:    ICD-10-CM   1. Mixed obsessional thoughts and acts F42.2   2. Adult ADHD F90.9   3. Mild episode of recurrent major depressive disorder (HCC) F33.0     History of Present Illness:  28 yo divorced female presents with long standing problems with anxiety (generalized as well as panic type), obsessive thoughts and ritualistic checking, difficulty with focusing, task completion, racing thoughts, irritability, impulsiveness and initial and middle insomnia. She also has some depression and in the past was dx with major depressive disorder. She denies ever feeling hopeless, helpless or suicidal. She has no hx of IP psychiatric admissions. She has had problems with task completion in grade school, struggled on testes while in college [employer]). 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 In addition to MDD she was dx with having GAD, OCD, panic disorder and suspected of bipolar depression. She cannot however describe a distinct manic/hypomanic episodes. Her sx are chronic suggestive of attention deficit disorder for which she has never been treated. She has pituitary prolactin producing adenoma which is being treated pharmacologically with cabergoline at this time   Patient denies alcohol or drug abuse. There is an extensive history of depression/anxiety in family. She is not aware of any family members having bipolar disorder or ADHD.  Associated Signs/Symptoms: Depression Symptoms:  depressed mood, difficulty concentrating, impaired memory, anxiety, panic attacks, disturbed sleep, (Hypo) Manic Symptoms:  Distractibility, Irritable Mood, Anxiety Symptoms:  Excessive Worry, Panic  Symptoms, Obsessive Compulsive Symptoms:   Checking,, Psychotic Symptoms:  none PTSD Symptoms: Negative  Past Psychiatric History: see above  Previous Psychotropic Medications: trials of citalopram, sertyraline, duloxetine, buspirone, clonazepam amitryptyline, trazodone  Substance Abuse History in the last 12 months:  No.  Consequences of Substance Abuse: Negative  Past Medical History:  Past Medical History:  Diagnosis Date  . Chicken pox   . Depression   . GERD (gastroesophageal reflux disease)   . Hyperlipidemia   . Migraine    History reviewed. No pertinent surgical history.  Family Psychiatric History: reviewed  Family History:  Family History  Problem Relation Age of Onset  . Arthritis Mother   . Depression Mother   . Hearing loss Mother   . Mental illness Mother   . Hyperlipidemia Father   . Hypertension Father   . Depression Sister   . Mental illness Sister   . Arthritis Maternal Grandmother   . Cancer Maternal Grandmother   . Kidney disease Maternal Grandmother   . Hearing loss Maternal Grandfather   . Stroke Maternal Grandfather   . Depression Paternal Grandfather   . Depression Sister   . Hyperlipidemia Sister   . Mental illness Sister     Social History:   Social History   Socioeconomic History  . Marital status: Single    Spouse name: Not on file  . Number of children: Not on file  . Years of education: Not on file  . Highest education level: Not on file  Occupational History  . Not on file  Social Needs  . Financial resource strain: Not on file  . Food insecurity:    Worry: Not on file    Inability: Not on file  . Transportation needs:    Medical: Not on file  Non-medical: Not on file  Tobacco Use  . Smoking status: Never Smoker  . Smokeless tobacco: Never Used  Substance and Sexual Activity  . Alcohol use: Not Currently  . Drug use: Never  . Sexual activity: Yes  Lifestyle  . Physical activity:    Days per week: Not on file     Minutes per session: Not on file  . Stress: Not on file  Relationships  . Social connections:    Talks on phone: Not on file    Gets together: Not on file    Attends religious service: Not on file    Active member of club or organization: Not on file    Attends meetings of clubs or organizations: Not on file    Relationship status: Not on file  Other Topics Concern  . Not on file  Social History Narrative  . Not on file    Additional Social History: She was born in [place] ([place]), came to [place] at age of 87. Middle of thee sisters. College educated. Married for a year, divorced after finding her husband was unfaithful. No children. Recently moved to [facility] from [place].  Allergies:   Allergies  Allergen Reactions  . Tramadol Nausea And Vomiting  . Ibuprofen Other (See Comments)    Intolerance.  Gastric upset   . Gabapentin     Metabolic Disorder Labs: No results found for: HGBA1C, MPG Lab Results  Component Value Date   PROLACTIN 37.7 (H) 12/09/2018   No results found for: CHOL, TRIG, HDL, CHOLHDL, VLDL, LDLCALC Lab Results  Component Value Date   TSH 1.63 12/09/2018    Therapeutic Level Labs: No results found for: LITHIUM No results found for: CBMZ No results found for: VALPROATE  Current Medications: Current Outpatient Medications  Medication Sig Dispense Refill  . cabergoline (DOSTINEX) 0.5 MG tablet Take 1/2 tablet twice weekly 12 tablet 2  . omeprazole (PRILOSEC) 20 MG capsule Take 20 mg by mouth daily.    . Vitamin D, Ergocalciferol, (DRISDOL) 1.25 MG (50000 UT) CAPS capsule Take 1 capsule (50,000 Units total) by mouth every 7 (seven) days. 15 capsule 1  . amphetamine-dextroamphetamine (ADDERALL) 10 MG tablet Take 1 tablet (10 mg total) by mouth 2 (two) times daily with a meal for 30 days. 60 tablet 0  . clonazePAM (KLONOPIN) 0.5 MG disintegrating tablet Take 1 tablet (0.5 mg total) by mouth 2 (two) times daily as needed (anxiaty). 30 tablet 0  . fluvoxaMINE  (LUVOX) 50 MG tablet Take 1 tablet (50 mg total) by mouth at bedtime for 30 days. 30 tablet 0   No current facility-administered medications for this visit.     Musculoskeletal: Strength & Muscle Tone: within normal limits Gait & Station: normal Patient leans: N/A  Psychiatric Specialty Exam: Review of Systems  Constitutional: Negative.   HENT: Negative.   Eyes: Negative.   Respiratory: Negative.   Cardiovascular: Negative.   Gastrointestinal: Positive for heartburn.  Genitourinary: Negative.   Musculoskeletal: Negative.   Skin: Negative.   Neurological: Negative.   Endo/Heme/Allergies: Negative.   Psychiatric/Behavioral: The patient is nervous/anxious and has insomnia.     Blood pressure 125/64, pulse 98, height 5\' 6"  (1.676 m), weight 137 lb (62.1 kg).Body mass index is 22.11 kg/m.  General Appearance: Neat and Well Groomed  Eye Contact:  Good  Speech:  Clear and Coherent  Volume:  Normal  Mood:  Anxious and Depressed  Affect:  Full Range  Thought Process:  Goal Directed  Orientation:  Full (Time,  Place, and Person)  Thought Content:  Logical  Suicidal Thoughts:  No  Homicidal Thoughts:  No  Memory:  Immediate;   Fair Recent;   Fair Remote;   Fair  Judgement:  Intact  Insight:  Fair  Psychomotor Activity:  Restlessness  Concentration:  Concentration: Poor  Recall:  [place] of Knowledge:Good  Language: Good  Akathisia:  Negative  Handed:  Right  AIMS (if indicated):  not done  Assets:  Communication Skills Desire for Improvement Financial Resources/Insurance Housing Talents/Skills Vocational/Educational  ADL's:  Intact  Cognition: WNL  Sleep:  Poor   Screenings: GAD-7     Office Visit from 12/09/2018 in [facility]  Total GAD-7 Score  18    PHQ2-9     Office Visit from 01/16/2019 in [place] from 12/09/2018 in [place]  PHQ-2 Total Score  2  4  PHQ-9 Total Score  14   20      Assessment and Plan: 28 yo divorced female with constellation of sx suggestive of MDD, OCD, panic disorder and ADHD. Poor sleep - 2-3 hours. Meggan struggles on a new job due to problems with focusing, this in turn cause increased anxiety. Depression at this time is mild to moderate, anxiety high. She tends to obsess, checks her work multiple times - it takes forever for her to get things ready. She has tried several SSRIs but at dose too low to benefit OCD sx. Never tried any medications for ADHD. Elavil and trazodone were too sedating in AM.  Dx; OCD; ADD; Panic disorder; MDD recurrent mild-moderate  Plan: 1. DC Zoloft 25 mg and uinstead start Luvox 50 mg at HS. 2. Trial of Adderall IR for ADD - 5 mg x 5 days, then 10 mfg x 5 days then 15 mg to find effective and well tolerated dose. 3. Clonazepam 0.5 mg bid prn anxiety (until fluvoxamine begins to work). The plan was discussed with patient. I spend 60 minutes in direct face to face clinical contact with the patient and devoted approximately 50% of this time to explanation of diagnosis, discussion of treatment options and med education. Return to clinic in 4 weeks.   Stephanie Acre, MD 3/3/20202:10 PM

## 2019-02-11 ENCOUNTER — Ambulatory Visit: Payer: Self-pay | Admitting: Endocrinology

## 2019-02-12 ENCOUNTER — Other Ambulatory Visit: Payer: BLUE CROSS/BLUE SHIELD

## 2019-02-12 ENCOUNTER — Ambulatory Visit: Payer: BLUE CROSS/BLUE SHIELD | Admitting: Psychology

## 2019-02-12 ENCOUNTER — Ambulatory Visit: Payer: Self-pay | Admitting: Endocrinology

## 2019-02-12 ENCOUNTER — Other Ambulatory Visit: Payer: Self-pay

## 2019-02-12 DIAGNOSIS — D352 Benign neoplasm of pituitary gland: Secondary | ICD-10-CM | POA: Diagnosis not present

## 2019-02-13 LAB — PROLACTIN: Prolactin: 6 ng/mL (ref 4.8–23.3)

## 2019-02-19 ENCOUNTER — Ambulatory Visit (INDEPENDENT_AMBULATORY_CARE_PROVIDER_SITE_OTHER): Payer: BLUE CROSS/BLUE SHIELD | Admitting: Endocrinology

## 2019-02-19 ENCOUNTER — Ambulatory Visit: Payer: BLUE CROSS/BLUE SHIELD | Admitting: Psychology

## 2019-02-19 ENCOUNTER — Encounter: Payer: Self-pay | Admitting: Endocrinology

## 2019-02-19 ENCOUNTER — Other Ambulatory Visit: Payer: Self-pay

## 2019-02-19 VITALS — BP 102/60 | HR 110 | Temp 97.3°F | Ht 66.0 in | Wt 135.0 lb

## 2019-02-19 DIAGNOSIS — D352 Benign neoplasm of pituitary gland: Secondary | ICD-10-CM | POA: Diagnosis not present

## 2019-02-19 NOTE — Progress Notes (Signed)
Patient ID: Michele Foster, female   DOB: [birth year], 28 y.o.   MRN: [id number]             Referring PCP: [name]  Chief complaint: Follow-up of high prolactin  History of Present Illness  Prior history: She was apparently evaluated for milky breast discharge when she was in high school along with delayed or missed menstrual cycles. She was told that her prolactin level was high and she also had a 5 mm pituitary microadenoma on MRI She was treated with unknown medication which she thinks she took for less than a year No further details are available   Since she had an IUD about 5 years ago she does not have menstrual cycles  She does not complain of any  milky breast discharge present at this time  Because of her high prolactin level of 38 she is not taking cabergoline 0.25 mg twice daily She says that initially it was causing her to have dizziness especially in the mornings but this is progressively getting better and this is happening only to a certain extent for couple of days  On her initial consultation pituitary function was evaluated and was normal  Prolactin levels:  Lab Results  Component Value Date   PROLACTIN 6.0 02/12/2019   PROLACTIN 37.7 (H) 12/09/2018     MRI of pituitary gland done in 09/2009 shows  Several images suggest a 5 mm T2 hypointense, hypoenhancing lesion in the left aspect of the sella which is suspicious for a pituitary microadenoma  OTHER problems:   She has had periodic headaches, change in appetite, nausea as well as difficulty sleeping and occasional lightheadedness Her symptoms overall are better with being on treatment with Luvox, Klonopin and Adderall Her weight has leveled off   Wt Readings from Last 3 Encounters:  02/19/19 135 lb (61.2 kg)  01/16/19 138 lb 6 oz (62.8 kg)  01/01/19 136 lb 12.8 oz (62.1 kg)     Allergies as of 02/19/2019      Reactions   Tramadol Nausea And Vomiting   Ibuprofen Other (See Comments)   Intolerance.   Gastric upset   Gabapentin       Medication List       Accurate as of February 19, 2019 11:27 AM. Always use your most recent med list.        amphetamine-dextroamphetamine 10 MG tablet Commonly known as:  Adderall Take 1 tablet (10 mg total) by mouth 2 (two) times daily with a meal for 30 days.   cabergoline 0.5 MG tablet Commonly known as:  DOSTINEX Take 1/2 tablet twice weekly   clonazePAM 0.5 MG disintegrating tablet Commonly known as:  KLONOPIN Take 1 tablet (0.5 mg total) by mouth 2 (two) times daily as needed (anxiaty).   fluvoxaMINE 50 MG tablet Commonly known as:  LUVOX Take 1 tablet (50 mg total) by mouth at bedtime for 30 days.   omeprazole 20 MG capsule Commonly known as:  PRILOSEC Take 20 mg by mouth daily.   Vitamin D (Ergocalciferol) 1.25 MG (50000 UT) Caps capsule Commonly known as:  DRISDOL Take 1 capsule (50,000 Units total) by mouth every 7 (seven) days.       Allergies:  Allergies  Allergen Reactions  . Tramadol Nausea And Vomiting  . Ibuprofen Other (See Comments)    Intolerance.  Gastric upset   . Gabapentin     Past Medical History:  Diagnosis Date  . Chicken pox   . Depression   . GERD (  gastroesophageal reflux disease)   . Hyperlipidemia   . Migraine     History reviewed. No pertinent surgical history.  Family History  Problem Relation Age of Onset  . Arthritis Mother   . Depression Mother   . Hearing loss Mother   . Mental illness Mother   . Hyperlipidemia Father   . Hypertension Father   . Depression Sister   . Mental illness Sister   . Arthritis Maternal Grandmother   . Cancer Maternal Grandmother   . Kidney disease Maternal Grandmother   . Hearing loss Maternal Grandfather   . Stroke Maternal Grandfather   . Depression Paternal Grandfather   . Depression Sister   . Hyperlipidemia Sister   . Mental illness Sister     Social History:  reports that she has never smoked. She has never used smokeless tobacco. She  reports previous alcohol use. She reports that she does not use drugs.   Review of Systems      Physical Exam  BP 102/60 (BP Location: Left Arm, Patient Position: Sitting, Cuff Size: Normal)   Pulse (!) 110   Temp (!) 97.3 F (36.3 C) (Oral)   Ht 5\' 6"  (1.676 m)   Wt 135 lb (61.2 kg)   SpO2 99%   BMI 21.79 kg/m    ASSESSMENT:     5 mm prolactinoma with baseline prolactin level of about 38 and minimal symptoms This was diagnosed at least 10 years ago  Currently with cabergoline 0.25 mg twice weekly her prolactin level is down to 6 Currently she is not having any galactorrhea Also previously was not having menstrual cycles because of her having IUD which she does not have at this time  Other ongoing problems include anxiety and depression, migraine headaches to be addressed by PCP    PLAN:     Since she is tolerating the cabergoline better recently she will continue the same dose Discussed that since she had a relatively small prolactinoma and her treatment indicates control of the hyperprolactinemia there is no need to follow-up any MRI scans at this time Follow-up in 4 months  [name] 02/19/19   Note: This office note was prepared with Dragon voice recognition system technology. Any transcriptional errors that result from this process are unintentional.

## 2019-02-26 ENCOUNTER — Ambulatory Visit: Payer: BLUE CROSS/BLUE SHIELD | Admitting: Psychology

## 2019-02-27 ENCOUNTER — Other Ambulatory Visit (HOSPITAL_COMMUNITY): Payer: Self-pay | Admitting: *Deleted

## 2019-02-27 MED ORDER — FLUVOXAMINE MALEATE 50 MG PO TABS: 50.0000 mg | ORAL_TABLET | Freq: Every day | ORAL | 0 refills | Status: DC

## 2019-03-04 ENCOUNTER — Other Ambulatory Visit: Payer: Self-pay

## 2019-03-04 DIAGNOSIS — E559 Vitamin D deficiency, unspecified: Secondary | ICD-10-CM

## 2019-03-05 ENCOUNTER — Ambulatory Visit: Payer: BLUE CROSS/BLUE SHIELD | Admitting: Psychology

## 2019-03-10 ENCOUNTER — Other Ambulatory Visit (HOSPITAL_COMMUNITY): Payer: Self-pay | Admitting: Psychiatry

## 2019-03-10 ENCOUNTER — Ambulatory Visit: Payer: BLUE CROSS/BLUE SHIELD | Admitting: Family Medicine

## 2019-03-10 ENCOUNTER — Other Ambulatory Visit: Payer: Self-pay

## 2019-03-10 ENCOUNTER — Other Ambulatory Visit (INDEPENDENT_AMBULATORY_CARE_PROVIDER_SITE_OTHER): Payer: BC Managed Care – PPO

## 2019-03-10 DIAGNOSIS — E559 Vitamin D deficiency, unspecified: Secondary | ICD-10-CM

## 2019-03-10 LAB — VITAMIN D 25 HYDROXY (VIT D DEFICIENCY, FRACTURES): VITD: 50.63 ng/mL (ref 30.00–100.00)

## 2019-03-10 NOTE — Progress Notes (Signed)
Patient and lab tech wore face masks during visit per COVID-19 protocol

## 2019-03-12 ENCOUNTER — Ambulatory Visit: Payer: BLUE CROSS/BLUE SHIELD | Admitting: Psychology

## 2019-03-12 ENCOUNTER — Ambulatory Visit (HOSPITAL_COMMUNITY): Payer: BLUE CROSS/BLUE SHIELD | Admitting: Psychiatry

## 2019-03-14 ENCOUNTER — Other Ambulatory Visit (HOSPITAL_COMMUNITY): Payer: Self-pay | Admitting: Psychiatry

## 2019-03-14 MED ORDER — FLUVOXAMINE MALEATE 50 MG PO TABS: 50.0000 mg | ORAL_TABLET | Freq: Every day | ORAL | 0 refills | Status: DC

## 2019-03-14 MED ORDER — FLUVOXAMINE MALEATE 50 MG PO TABS: 50.0000 mg | ORAL_TABLET | Freq: Every day | ORAL | 1 refills | Status: DC

## 2019-03-17 ENCOUNTER — Other Ambulatory Visit: Payer: Self-pay

## 2019-03-17 ENCOUNTER — Ambulatory Visit (INDEPENDENT_AMBULATORY_CARE_PROVIDER_SITE_OTHER): Payer: BLUE CROSS/BLUE SHIELD | Admitting: Psychiatry

## 2019-03-17 DIAGNOSIS — F909 Attention-deficit hyperactivity disorder, unspecified type: Secondary | ICD-10-CM

## 2019-03-17 DIAGNOSIS — R450 Nervousness: Secondary | ICD-10-CM

## 2019-03-17 DIAGNOSIS — F3341 Major depressive disorder, recurrent, in partial remission: Secondary | ICD-10-CM | POA: Insufficient documentation

## 2019-03-17 DIAGNOSIS — F422 Mixed obsessional thoughts and acts: Secondary | ICD-10-CM

## 2019-03-17 DIAGNOSIS — F330 Major depressive disorder, recurrent, mild: Secondary | ICD-10-CM

## 2019-03-17 DIAGNOSIS — Z818 Family history of other mental and behavioral disorders: Secondary | ICD-10-CM

## 2019-03-17 MED ORDER — CLONAZEPAM 0.5 MG PO TBDP: 0.5000 mg | ORAL_TABLET | Freq: Two times a day (BID) | ORAL | 0 refills | Status: DC | PRN

## 2019-03-17 MED ORDER — AMPHETAMINE-DEXTROAMPHETAMINE 20 MG PO TABS: 20.0000 mg | ORAL_TABLET | Freq: Two times a day (BID) | ORAL | 0 refills | Status: DC

## 2019-03-17 NOTE — Progress Notes (Signed)
[facility] MD/PA/NP OP Progress Note  03/17/2019 1:48 PM Michele Foster  MRN:  [id number]  Interview was conducted using teleconferencing and I verified that I was speaking with the correct person using two identifiers. I discussed the limitations of evaluation and management by telemedicine and  the availability of in person appointments. Patient expressed understanding and agreed to proceed.  Chief Complaint: Lack of  Focus.  HPI: 28 yo divorced female with constellation of sx suggestive of MDD, OCD, panic disorder and ADHD. Sleep improved, low anxiety - uses clonazepam prn very sporadically. Depression close to remission, tolerates fluvoxamine well - we will not increase the dose given level of improvement. Started on Adderall appears that 15 - 20 mg bid is the right dose - she sometimes does not need the afternoon dose (depending on workload). No SI, no psychosis.   Visit Diagnosis:    ICD-10-CM   1. Adult ADHD F90.9   2. Mixed obsessional thoughts and acts F42.2   3. Mild episode of recurrent major depressive disorder (HCC) F33.0     Past Psychiatric History: Please see intake H&P.  Past Medical History:  Past Medical History:  Diagnosis Date  . Chicken pox   . Depression   . GERD (gastroesophageal reflux disease)   . Hyperlipidemia   . Migraine    No past surgical history on file.  Family Psychiatric History: Reviewed  Family History:  Family History  Problem Relation Age of Onset  . Arthritis Mother   . Depression Mother   . Hearing loss Mother   . Mental illness Mother   . Hyperlipidemia Father   . Hypertension Father   . Depression Sister   . Mental illness Sister   . Arthritis Maternal Grandmother   . Cancer Maternal Grandmother   . Kidney disease Maternal Grandmother   . Hearing loss Maternal Grandfather   . Stroke Maternal Grandfather   . Depression Paternal Grandfather   . Depression Sister   . Hyperlipidemia Sister   . Mental illness Sister     Social  History:  Social History   Socioeconomic History  . Marital status: Single    Spouse name: Not on file  . Number of children: Not on file  . Years of education: Not on file  . Highest education level: Not on file  Occupational History  . Not on file  Social Needs  . Financial resource strain: Not on file  . Food insecurity:    Worry: Not on file    Inability: Not on file  . Transportation needs:    Medical: Not on file    Non-medical: Not on file  Tobacco Use  . Smoking status: Never Smoker  . Smokeless tobacco: Never Used  Substance and Sexual Activity  . Alcohol use: Not Currently  . Drug use: Never  . Sexual activity: Yes  Lifestyle  . Physical activity:    Days per week: Not on file    Minutes per session: Not on file  . Stress: Not on file  Relationships  . Social connections:    Talks on phone: Not on file    Gets together: Not on file    Attends religious service: Not on file    Active member of club or organization: Not on file    Attends meetings of clubs or organizations: Not on file    Relationship status: Not on file  Other Topics Concern  . Not on file  Social History Narrative  . Not on file  Allergies:  Allergies  Allergen Reactions  . Tramadol Nausea And Vomiting  . Ibuprofen Other (See Comments)    Intolerance.  Gastric upset   . Gabapentin     Metabolic Disorder Labs: No results found for: HGBA1C, MPG Lab Results  Component Value Date   PROLACTIN 6.0 02/12/2019   PROLACTIN 37.7 (H) 12/09/2018   No results found for: CHOL, TRIG, HDL, CHOLHDL, VLDL, LDLCALC Lab Results  Component Value Date   TSH 1.63 12/09/2018    Therapeutic Level Labs: No results found for: LITHIUM No results found for: VALPROATE No components found for:  CBMZ  Current Medications: Current Outpatient Medications  Medication Sig Dispense Refill  . amphetamine-dextroamphetamine (ADDERALL) 10 MG tablet Take 1 tablet (10 mg total) by mouth 2 (two) times  daily with a meal for 30 days. 60 tablet 0  . cabergoline (DOSTINEX) 0.5 MG tablet Take 1/2 tablet twice weekly 12 tablet 2  . clonazePAM (KLONOPIN) 0.5 MG disintegrating tablet Take 1 tablet (0.5 mg total) by mouth 2 (two) times daily as needed (anxiaty). 30 tablet 0  . fluvoxaMINE (LUVOX) 50 MG tablet Take 1 tablet (50 mg total) by mouth at bedtime. 90 tablet 1  . omeprazole (PRILOSEC) 20 MG capsule Take 20 mg by mouth daily.     No current facility-administered medications for this visit.      Psychiatric Specialty Exam: Review of Systems  Psychiatric/Behavioral: The patient is nervous/anxious.   All other systems reviewed and are negative.   There were no vitals taken for this visit.There is no height or weight on file to calculate BMI.  General Appearance: NA  Eye Contact:  NA  Speech:  Clear and Coherent  Volume:  Normal  Mood:  "Better"  Affect:  NA  Thought Process:  Goal Directed  Orientation:  Full (Time, Place, and Person)  Thought Content: Logical   Suicidal Thoughts:  No  Homicidal Thoughts:  No  Memory:  Immediate;   Good Recent;   Good Remote;   Good  Judgement:  Good  Insight:  Good  Psychomotor Activity:  NA  Concentration:  Concentration: Good  Recall:  Good  Fund of Knowledge: Good  Language: Good  Akathisia:  NA  Handed:  Right  AIMS (if indicated): not done  Assets:  Communication Skills Desire for Improvement Financial Resources/Insurance Housing Physical Health Talents/Skills Vocational/Educational  ADL's:  Intact  Cognition: WNL  Sleep:  Good   Screenings: GAD-7     Office Visit from 12/09/2018 in [facility]  Total GAD-7 Score  18    PHQ2-9     Office Visit from 01/16/2019 in [facility] Office Visit from 12/09/2018 in [facility]  PHQ-2 Total Score  2  4  PHQ-9 Total Score  14  20       Assessment and Plan: 28 yo divorced female with constellation of sx suggestive of  MDD, OCD, panic disorder and ADHD. Sleep improved, low anxiety - uses clonazepam prn very sporadically. Depression close to remission, tolerates fluvoxamine well - we will not increase the dose given level of improvement. Started on Adderall appears that 15 - 20 mg bid is the right dose - she sometimes does not need the afternoon dose (depending on workload). No SI, no psychosis.  We will continue Luvox 50 mg at HS, Addrall 20 mg bid and clonazepam 0.25 mg prn anxiety. Return to clinic in 3 months.    Stephanie Acre, MD 03/17/2019, 1:48 PM

## 2019-03-19 ENCOUNTER — Ambulatory Visit: Payer: BLUE CROSS/BLUE SHIELD | Admitting: Psychology

## 2019-03-20 ENCOUNTER — Ambulatory Visit (INDEPENDENT_AMBULATORY_CARE_PROVIDER_SITE_OTHER): Payer: BC Managed Care – PPO | Admitting: Family Medicine

## 2019-03-20 ENCOUNTER — Encounter: Payer: Self-pay | Admitting: Family Medicine

## 2019-03-20 VITALS — Ht 66.0 in

## 2019-03-20 DIAGNOSIS — S161XXA Strain of muscle, fascia and tendon at neck level, initial encounter: Secondary | ICD-10-CM

## 2019-03-20 DIAGNOSIS — K219 Gastro-esophageal reflux disease without esophagitis: Secondary | ICD-10-CM | POA: Diagnosis not present

## 2019-03-20 MED ORDER — METHOCARBAMOL 500 MG PO TABS: 500.0000 mg | ORAL_TABLET | Freq: Three times a day (TID) | ORAL | 0 refills | Status: DC

## 2019-03-20 MED ORDER — PANTOPRAZOLE SODIUM 40 MG PO TBEC: 40.0000 mg | DELAYED_RELEASE_TABLET | Freq: Every day | ORAL | 1 refills | Status: AC

## 2019-03-20 NOTE — Progress Notes (Signed)
Established Patient Office Visit  Subjective:  Patient ID: Michele Foster, female    DOB: [birth year]  Age: 28 y.o. MRN: [id number]  CC:  Chief Complaint  Patient presents with  . pain in neck    HPI Michele Foster presents for evaluation and treatment of pain in the right side of her posterior neck.  No specific injury but she had been playing tennis a few days prior to onset.  Right-hand dominant.  There is some pain and tingling moving down the neck but she denies pain radiating out into the shoulder or arm.  There is no weakness or paresthesia in the right arm.  She has tried lidocaine patches IcyHot and ibuprofen.  Ibuprofen upsets her stomach.  Michele Foster.  Says that the omeprazole is not managing her reflux symptoms well.  Protonix is worked in the past for her.  Patient is working daily in [employer] and says that this job is going well for her.  Past Medical History:  Diagnosis Date  . Chicken pox   . Depression   . GERD (gastroesophageal reflux disease)   . Hyperlipidemia   . Migraine     History reviewed. No pertinent surgical history.  Family History  Problem Relation Age of Onset  . Arthritis Mother   . Depression Mother   . Hearing loss Mother   . Mental illness Mother   . Hyperlipidemia Father   . Hypertension Father   . Depression Sister   . Mental illness Sister   . Arthritis Maternal Grandmother   . Cancer Maternal Grandmother   . Kidney disease Maternal Grandmother   . Hearing loss Maternal Grandfather   . Stroke Maternal Grandfather   . Depression Paternal Grandfather   . Depression Sister   . Hyperlipidemia Sister   . Mental illness Sister     Social History   Socioeconomic History  . Marital status: Single    Spouse name: Not on file  . Number of children: Not on file  . Years of education: Not on file  . Highest education level: Not on file  Occupational History  . Not on file  Social Needs  .  Financial resource strain: Not on file  . Food insecurity:    Worry: Not on file    Inability: Not on file  . Transportation needs:    Medical: Not on file    Non-medical: Not on file  Tobacco Use  . Smoking status: Never Smoker  . Smokeless tobacco: Never Used  Substance and Sexual Activity  . Alcohol use: Not Currently  . Drug use: Never  . Sexual activity: Yes  Lifestyle  . Physical activity:    Days per week: Not on file    Minutes per session: Not on file  . Stress: Not on file  Relationships  . Social connections:    Talks on phone: Not on file    Gets together: Not on file    Attends religious service: Not on file    Active member of club or organization: Not on file    Attends meetings of clubs or organizations: Not on file    Relationship status: Not on file  . Intimate partner violence:    Fear of current or ex partner: Not on file    Emotionally abused: Not on file    Physically abused: Not on file    Forced sexual activity: Not on file  Other Topics Concern  . Not  on file  Social History Narrative  . Not on file    Outpatient Medications Prior to Visit  Medication Sig Dispense Refill  . amphetamine-dextroamphetamine (ADDERALL) 20 MG tablet Take 1 tablet (20 mg total) by mouth 2 (two) times daily with a meal for 30 days. 60 tablet 0  . cabergoline (DOSTINEX) 0.5 MG tablet Take 1/2 tablet twice weekly 12 tablet 2  . clonazePAM (KLONOPIN) 0.5 MG disintegrating tablet Take 1 tablet (0.5 mg total) by mouth 2 (two) times daily as needed (anxiaty). 30 tablet 0  . fluvoxaMINE (LUVOX) 50 MG tablet Take 1 tablet (50 mg total) by mouth at bedtime. 90 tablet 1  . omeprazole (PRILOSEC) 20 MG capsule Take 20 mg by mouth daily.     No facility-administered medications prior to visit.     Allergies  Allergen Reactions  . Tramadol Nausea And Vomiting  . Ibuprofen Other (See Comments)    Intolerance.  Gastric upset   . Gabapentin     ROS Review of Systems   Constitutional: Negative for diaphoresis, fatigue, fever and unexpected weight change.  Respiratory: Negative.   Cardiovascular: Negative.   Gastrointestinal: Negative.   Musculoskeletal: Positive for myalgias, neck pain and neck stiffness.  Neurological: Negative for weakness and numbness.  Hematological: Does not bruise/bleed easily.  Psychiatric/Behavioral: Negative.       Objective:    Physical Exam  Constitutional: She is oriented to person, place, and time. She appears well-developed and well-nourished. No distress.  HENT:  Head: Normocephalic and atraumatic.  Right Ear: External ear normal.  Left Ear: External ear normal.  Eyes: Right eye exhibits no discharge. Left eye exhibits no discharge. No scleral icterus.  Neck: No JVD present. No tracheal deviation present.  Pulmonary/Chest: Effort normal. No stridor.  Musculoskeletal:     Right shoulder: She exhibits decreased range of motion.  Neurological: She is alert and oriented to person, place, and time.  Skin: Skin is warm and dry. She is not diaphoretic.  Psychiatric: She has a normal mood and affect. Her behavior is normal.    Ht 5\' 6"  (1.676 m)   BMI 21.79 kg/m  Wt Readings from Last 3 Encounters:  02/19/19 135 lb (61.2 kg)  01/16/19 138 lb 6 oz (62.8 kg)  01/01/19 136 lb 12.8 oz (62.1 kg)     Health Maintenance Due  Topic Date Due  . HIV Screening  06/08/2008    There are no preventive care reminders to display for this patient.  Lab Results  Component Value Date   TSH 1.63 12/09/2018   Lab Results  Component Value Date   WBC 4.9 12/09/2018   HGB 12.9 12/09/2018   HCT 38.8 12/09/2018   MCV 86.9 12/09/2018   PLT 242.0 12/09/2018   Lab Results  Component Value Date   NA 138 12/09/2018   K 4.8 12/09/2018   CO2 26 12/09/2018   GLUCOSE 90 12/09/2018   BUN 8 12/09/2018   CREATININE 0.58 12/09/2018   BILITOT 0.3 12/09/2018   ALKPHOS 50 12/09/2018   AST 28 12/09/2018   ALT 28 12/09/2018    PROT 7.3 12/09/2018   ALBUMIN 4.8 12/09/2018   CALCIUM 9.9 12/09/2018   GFR 134.08 12/09/2018   No results found for: CHOL No results found for: HDL No results found for: LDLCALC No results found for: TRIG No results found for: CHOLHDL No results found for: HGBA1C    Assessment & Plan:   Problem List Items Addressed This Visit  Digestive   Gastroesophageal reflux disease - Primary   Relevant Medications   pantoprazole (PROTONIX) 40 MG tablet    Other Visit Diagnoses    Strain of neck muscle, initial encounter       Relevant Medications   methocarbamol (ROBAXIN) 500 MG tablet      Meds ordered this encounter  Medications  . methocarbamol (ROBAXIN) 500 MG tablet    Sig: Take 1 tablet (500 mg total) by mouth 3 (three) times daily.    Dispense:  50 tablet    Refill:  0  . pantoprazole (PROTONIX) 40 MG tablet    Sig: Take 1 tablet (40 mg total) by mouth daily.    Dispense:  90 tablet    Refill:  1    Follow-up: No follow-ups on file.    Libby Maw, MDVirtual Visit via Video Note  I connected with Michele Foster on 03/20/19 at  1:00 PM EDT by a video enabled telemedicine application and verified that I am speaking with the correct person using two identifiers.   I discussed the limitations of evaluation and management by telemedicine and the availability of in person appointments. The patient expressed understanding and agreed to proceed.  History of Present Illness:    Observations/Objective:   Assessment and Plan:   Follow Up Instructions:    I discussed the assessment and treatment plan with the patient. The patient was provided an opportunity to ask questions and all were answered. The patient agreed with the plan and demonstrated an understanding of the instructions.   The patient was advised to call back or seek an in-person evaluation if the symptoms worsen or if the condition fails to improve as anticipated.  I provided 15 minutes of  non-face-to-face time during this encounter.   Patient will take Robaxin 3 times daily and continue Tylenol.  Encouraged her to also continue heat and Foster.  Consider sports medicine referral if not improving in a week or so.

## 2019-03-26 ENCOUNTER — Ambulatory Visit: Payer: BLUE CROSS/BLUE SHIELD | Admitting: Psychology

## 2019-04-02 ENCOUNTER — Ambulatory Visit: Payer: BLUE CROSS/BLUE SHIELD | Admitting: Psychology

## 2019-04-09 ENCOUNTER — Ambulatory Visit: Payer: BLUE CROSS/BLUE SHIELD | Admitting: Psychology

## 2019-04-16 ENCOUNTER — Ambulatory Visit: Payer: BLUE CROSS/BLUE SHIELD | Admitting: Psychology

## 2019-04-23 ENCOUNTER — Telehealth: Payer: Self-pay | Admitting: Family Medicine

## 2019-04-23 ENCOUNTER — Ambulatory Visit: Payer: BLUE CROSS/BLUE SHIELD | Admitting: Psychology

## 2019-04-23 DIAGNOSIS — S161XXA Strain of muscle, fascia and tendon at neck level, initial encounter: Secondary | ICD-10-CM

## 2019-04-23 DIAGNOSIS — R102 Pelvic and perineal pain: Secondary | ICD-10-CM | POA: Diagnosis not present

## 2019-04-23 NOTE — Telephone Encounter (Signed)
Okay to refill? 

## 2019-04-23 NOTE — Telephone Encounter (Signed)
Copied from [place] [phone]. Topic: Quick Communication - Rx Refill/Question >> Apr 23, 2019 10:37 AM Nils Flack wrote: Medication: methocarbamol (ROBAXIN) 500 MG tablet  Has the patient contacted their pharmacy? No. (Agent: If no, request that the patient contact the pharmacy for the refill.) (Agent: If yes, when and what did the pharmacy advise?)  Preferred Pharmacy (with phone number or street name): [facility] [place]   Agent: Please be advised that RX refills may take up to 3 business days. We ask that you follow-up with your pharmacy.

## 2019-04-24 MED ORDER — METHOCARBAMOL 500 MG PO TABS: 500.0000 mg | ORAL_TABLET | Freq: Three times a day (TID) | ORAL | 0 refills | Status: DC

## 2019-04-30 ENCOUNTER — Ambulatory Visit: Payer: BLUE CROSS/BLUE SHIELD | Admitting: Psychology

## 2019-05-07 ENCOUNTER — Ambulatory Visit: Payer: BLUE CROSS/BLUE SHIELD | Admitting: Psychology

## 2019-05-08 DIAGNOSIS — K219 Gastro-esophageal reflux disease without esophagitis: Secondary | ICD-10-CM | POA: Diagnosis not present

## 2019-05-08 DIAGNOSIS — R109 Unspecified abdominal pain: Secondary | ICD-10-CM | POA: Diagnosis not present

## 2019-05-08 DIAGNOSIS — R140 Abdominal distension (gaseous): Secondary | ICD-10-CM | POA: Diagnosis not present

## 2019-05-08 DIAGNOSIS — Z800 Family history of malignant neoplasm of digestive organs: Secondary | ICD-10-CM | POA: Diagnosis not present

## 2019-05-14 ENCOUNTER — Ambulatory Visit: Payer: BLUE CROSS/BLUE SHIELD | Admitting: Psychology

## 2019-05-15 ENCOUNTER — Other Ambulatory Visit: Payer: Self-pay | Admitting: Gastroenterology

## 2019-05-15 ENCOUNTER — Other Ambulatory Visit (HOSPITAL_COMMUNITY): Payer: Self-pay | Admitting: Gastroenterology

## 2019-05-15 DIAGNOSIS — R110 Nausea: Secondary | ICD-10-CM

## 2019-05-15 DIAGNOSIS — R1033 Periumbilical pain: Secondary | ICD-10-CM

## 2019-05-21 ENCOUNTER — Ambulatory Visit: Payer: BLUE CROSS/BLUE SHIELD | Admitting: Psychology

## 2019-05-27 ENCOUNTER — Other Ambulatory Visit: Payer: Self-pay | Admitting: Family Medicine

## 2019-05-27 DIAGNOSIS — E559 Vitamin D deficiency, unspecified: Secondary | ICD-10-CM

## 2019-05-28 ENCOUNTER — Ambulatory Visit: Payer: BLUE CROSS/BLUE SHIELD | Admitting: Psychology

## 2019-06-04 ENCOUNTER — Other Ambulatory Visit (HOSPITAL_COMMUNITY): Payer: Self-pay | Admitting: Psychiatry

## 2019-06-04 ENCOUNTER — Telehealth (HOSPITAL_COMMUNITY): Payer: Self-pay

## 2019-06-04 ENCOUNTER — Ambulatory Visit: Payer: BLUE CROSS/BLUE SHIELD | Admitting: Psychology

## 2019-06-04 MED ORDER — ZOLPIDEM TARTRATE 5 MG PO TABS: 5.0000 mg | ORAL_TABLET | Freq: Every evening | ORAL | 0 refills | Status: DC | PRN

## 2019-06-04 NOTE — Telephone Encounter (Signed)
I ordered her Ambien 5 mg prn.

## 2019-06-04 NOTE — Telephone Encounter (Signed)
I called the patient and attempted to leave a message, voicemail is full

## 2019-06-04 NOTE — Telephone Encounter (Signed)
Patient called, he states he is not sleeping well and would like something to help. Please review and advise, thank you

## 2019-06-05 ENCOUNTER — Other Ambulatory Visit: Payer: Self-pay

## 2019-06-05 ENCOUNTER — Ambulatory Visit (HOSPITAL_COMMUNITY)
Admission: RE | Admit: 2019-06-05 | Discharge: 2019-06-05 | Disposition: A | Discharge status: Home / Self Care | Payer: BC Managed Care – PPO | Source: Ambulatory Visit | Attending: Gastroenterology | Admitting: Gastroenterology

## 2019-06-05 DIAGNOSIS — R110 Nausea: Secondary | ICD-10-CM | POA: Insufficient documentation

## 2019-06-05 DIAGNOSIS — R1033 Periumbilical pain: Secondary | ICD-10-CM | POA: Insufficient documentation

## 2019-06-05 DIAGNOSIS — R109 Unspecified abdominal pain: Secondary | ICD-10-CM | POA: Diagnosis not present

## 2019-06-05 MED ORDER — TECHNETIUM TC 99M SULFUR COLLOID
2.0000 | Freq: Once | INTRAVENOUS | Status: AC | PRN
  Administered 2019-06-05: 2 via ORAL

## 2019-06-08 DIAGNOSIS — R8290 Unspecified abnormal findings in urine: Secondary | ICD-10-CM | POA: Diagnosis not present

## 2019-06-08 DIAGNOSIS — Z1159 Encounter for screening for other viral diseases: Secondary | ICD-10-CM | POA: Diagnosis not present

## 2019-06-08 DIAGNOSIS — R112 Nausea with vomiting, unspecified: Secondary | ICD-10-CM | POA: Diagnosis not present

## 2019-06-11 ENCOUNTER — Ambulatory Visit: Payer: BLUE CROSS/BLUE SHIELD | Admitting: Psychology

## 2019-06-16 ENCOUNTER — Other Ambulatory Visit (HOSPITAL_COMMUNITY): Payer: Self-pay

## 2019-06-16 ENCOUNTER — Other Ambulatory Visit: Payer: Self-pay

## 2019-06-16 ENCOUNTER — Ambulatory Visit (INDEPENDENT_AMBULATORY_CARE_PROVIDER_SITE_OTHER): Payer: BLUE CROSS/BLUE SHIELD | Admitting: Psychiatry

## 2019-06-16 DIAGNOSIS — F422 Mixed obsessional thoughts and acts: Secondary | ICD-10-CM | POA: Diagnosis not present

## 2019-06-16 DIAGNOSIS — F909 Attention-deficit hyperactivity disorder, unspecified type: Secondary | ICD-10-CM | POA: Diagnosis not present

## 2019-06-16 DIAGNOSIS — F3341 Major depressive disorder, recurrent, in partial remission: Secondary | ICD-10-CM

## 2019-06-16 MED ORDER — CLONAZEPAM 0.5 MG PO TBDP: 0.5000 mg | ORAL_TABLET | Freq: Two times a day (BID) | ORAL | 2 refills | Status: DC | PRN

## 2019-06-16 MED ORDER — AMPHETAMINE-DEXTROAMPHETAMINE 20 MG PO TABS: 20.0000 mg | ORAL_TABLET | Freq: Two times a day (BID) | ORAL | 0 refills | Status: DC

## 2019-06-16 NOTE — Progress Notes (Signed)
[place] MD/PA/NP OP Progress Note  06/16/2019 1:23 PM Michele Foster  MRN:  [id number] Interview was conducted by phone and I verified that I was speaking with the correct person using two identifiers. I discussed the limitations of evaluation and management by telemedicine and  the availability of in person appointments. Patient expressed understanding and agreed to proceed.  Chief Complaint: Anxiety, insomnia  HPI: 28 yo divorced female with constellation of sx suggestive of MDD, OCD, panic disorder and ADHD. Sleep poor lately, anxiety varies - uses clonazepam prn (effective) Depression practically in remission, OCD well controlled - on fluvoxamine. Adderall 30 mg bid is the right dose - she sometimes does not need the afternoon dose (depending on workload). No SI, no psychosis. Recently moved to [facility] - finds it to be an exciting change.    Visit Diagnosis:    ICD-10-CM   1. Adult ADHD  F90.9   2. Mixed obsessional thoughts and acts  F42.2   3. Major depressive disorder, recurrent, in partial remission (HCC)  F33.41     Past Psychiatric History: Please see intake H&P.  Past Medical History:  Past Medical History:  Diagnosis Date  . Chicken pox   . Depression   . GERD (gastroesophageal reflux disease)   . Hyperlipidemia   . Migraine    No past surgical history on file.  Family Psychiatric History: Reviewed.  Family History:  Family History  Problem Relation Age of Onset  . Arthritis Mother   . Depression Mother   . Hearing loss Mother   . Mental illness Mother   . Hyperlipidemia Father   . Hypertension Father   . Depression Sister   . Mental illness Sister   . Arthritis Maternal Grandmother   . Cancer Maternal Grandmother   . Kidney disease Maternal Grandmother   . Hearing loss Maternal Grandfather   . Stroke Maternal Grandfather   . Depression Paternal Grandfather   . Depression Sister   . Hyperlipidemia Sister   . Mental illness Sister     Social History:   Social History   Socioeconomic History  . Marital status: Legally Separated    Spouse name: Not on file  . Number of children: Not on file  . Years of education: Not on file  . Highest education level: Not on file  Occupational History  . Not on file  Social Needs  . Financial resource strain: Not on file  . Food insecurity    Worry: Not on file    Inability: Not on file  . Transportation needs    Medical: Not on file    Non-medical: Not on file  Tobacco Use  . Smoking status: Never Smoker  . Smokeless tobacco: Never Used  Substance and Sexual Activity  . Alcohol use: Not Currently  . Drug use: Never  . Sexual activity: Yes  Lifestyle  . Physical activity    Days per week: Not on file    Minutes per session: Not on file  . Stress: Not on file  Relationships  . Social Herbalist on phone: Not on file    Gets together: Not on file    Attends religious service: Not on file    Active member of club or organization: Not on file    Attends meetings of clubs or organizations: Not on file    Relationship status: Not on file  Other Topics Concern  . Not on file  Social History Narrative  . Not on file  Allergies:  Allergies  Allergen Reactions  . Tramadol Nausea And Vomiting  . Ibuprofen Other (See Comments)    Intolerance.  Gastric upset   . Gabapentin     Metabolic Disorder Labs: No results found for: HGBA1C, MPG Lab Results  Component Value Date   PROLACTIN 6.0 02/12/2019   PROLACTIN 37.7 (H) 12/09/2018   No results found for: CHOL, TRIG, HDL, CHOLHDL, VLDL, LDLCALC Lab Results  Component Value Date   TSH 1.63 12/09/2018    Therapeutic Level Labs: No results found for: LITHIUM No results found for: VALPROATE No components found for:  CBMZ  Current Medications: Current Outpatient Medications  Medication Sig Dispense Refill  . amphetamine-dextroamphetamine (ADDERALL) 20 MG tablet Take 1 tablet (20 mg total) by mouth 2 (two) times daily  with a meal. 60 tablet 0  . cabergoline (DOSTINEX) 0.5 MG tablet Take 1/2 tablet twice weekly 12 tablet 2  . clonazePAM (KLONOPIN) 0.5 MG disintegrating tablet Take 1 tablet (0.5 mg total) by mouth 2 (two) times daily as needed (anxiaty). 30 tablet 2  . fluvoxaMINE (LUVOX) 50 MG tablet Take 1 tablet (50 mg total) by mouth at bedtime. 90 tablet 1  . methocarbamol (ROBAXIN) 500 MG tablet Take 1 tablet (500 mg total) by mouth 3 (three) times daily. 50 tablet 0  . pantoprazole (PROTONIX) 40 MG tablet Take 1 tablet (40 mg total) by mouth daily. 90 tablet 1  . zolpidem (AMBIEN) 5 MG tablet Take 1 tablet (5 mg total) by mouth at bedtime as needed for sleep. 30 tablet 0   No current facility-administered medications for this visit.      Psychiatric Specialty Exam: Review of Systems  Psychiatric/Behavioral: The patient is nervous/anxious and has insomnia.   All other systems reviewed and are negative.   There were no vitals taken for this visit.There is no height or weight on file to calculate BMI.  General Appearance: NA  Eye Contact:  NA  Speech:  Clear and Coherent and Normal Rate  Volume:  Normal  Mood:  Anxious  Affect:  NA  Thought Process:  Goal Directed  Orientation:  Full (Time, Place, and Person)  Thought Content: Logical   Suicidal Thoughts:  No  Homicidal Thoughts:  No  Memory:  Immediate;   Good Recent;   Good Remote;   Good  Judgement:  Good  Insight:  Good  Psychomotor Activity:  NA  Concentration:  Concentration: Good  Recall:  Good  Fund of Knowledge: Good  Language: Good  Akathisia:  Negative  Handed:  Right  AIMS (if indicated): not done  Assets:  Communication Skills Desire for Improvement Financial Resources/Insurance Housing Resilience Social Support Talents/Skills Transportation  ADL's:  Intact  Cognition: WNL  Sleep:  Poor   Screenings: GAD-7     Office Visit from 12/09/2018 in [facility]  Total GAD-7 Score  18    PHQ2-9      Office Visit from 01/16/2019 in [place] Office Visit from 12/09/2018 in [facility]  PHQ-2 Total Score  2  4  PHQ-9 Total Score  14  20       Assessment and Plan: 28 yo divorced female with constellation of sx suggestive of MDD, OCD, panic disorder and ADHD. Sleep poor lately, anxiety varies - uses clonazepam prn (effective) Depression practically in remission, OCD well controlled - on fluvoxamine. Adderall 30 mg bid is the right dose - she sometimes does not need the afternoon dose (depending on  workload). No SI, no psychosis.   Plan: We will continue Luvox 50 mg at HS, Addrall 30 mg bid and clonazepam 0.5 mg prn anxiety. Ambien 5 mg prn sleep. Return to clinic in 3 months. The plan was discussed with patient who had an opportunity to ask questions and these were all answered. I spend 25 minutes in phone consultation with the patient.     Stephanie Acre, MD 06/16/2019, 1:23 PM

## 2019-06-18 ENCOUNTER — Ambulatory Visit: Payer: BLUE CROSS/BLUE SHIELD | Admitting: Psychology

## 2019-06-25 ENCOUNTER — Ambulatory Visit: Payer: BLUE CROSS/BLUE SHIELD | Admitting: Psychology

## 2019-06-27 ENCOUNTER — Ambulatory Visit: Payer: Self-pay | Admitting: Endocrinology

## 2019-06-30 ENCOUNTER — Telehealth (HOSPITAL_COMMUNITY): Payer: Self-pay

## 2019-06-30 NOTE — Telephone Encounter (Signed)
Patient is calling for a refill on her Ambien, she would like to know if you can increase the dose as she is having to take 2 some nights.

## 2019-07-01 ENCOUNTER — Other Ambulatory Visit (HOSPITAL_COMMUNITY): Payer: Self-pay | Admitting: Psychiatry

## 2019-07-01 MED ORDER — ZOLPIDEM TARTRATE 10 MG PO TABS: 10.0000 mg | ORAL_TABLET | Freq: Every evening | ORAL | 1 refills | Status: DC | PRN

## 2019-07-01 NOTE — Telephone Encounter (Signed)
Done

## 2019-07-02 ENCOUNTER — Other Ambulatory Visit: Payer: Self-pay

## 2019-07-02 ENCOUNTER — Ambulatory Visit: Payer: BLUE CROSS/BLUE SHIELD | Admitting: Psychology

## 2019-07-02 DIAGNOSIS — D352 Benign neoplasm of pituitary gland: Secondary | ICD-10-CM

## 2019-07-03 ENCOUNTER — Ambulatory Visit: Payer: BC Managed Care – PPO | Admitting: Endocrinology

## 2019-07-07 ENCOUNTER — Other Ambulatory Visit: Payer: Self-pay

## 2019-07-07 ENCOUNTER — Ambulatory Visit: Payer: BC Managed Care – PPO | Admitting: Endocrinology

## 2019-07-09 ENCOUNTER — Ambulatory Visit: Payer: BLUE CROSS/BLUE SHIELD | Admitting: Psychology

## 2019-07-11 DIAGNOSIS — D352 Benign neoplasm of pituitary gland: Secondary | ICD-10-CM | POA: Diagnosis not present

## 2019-07-12 LAB — PROLACTIN: Prolactin: 12.5 ng/mL (ref 4.8–23.3)

## 2019-07-14 ENCOUNTER — Encounter: Payer: Self-pay | Admitting: Endocrinology

## 2019-07-14 ENCOUNTER — Other Ambulatory Visit: Payer: Self-pay

## 2019-07-14 ENCOUNTER — Ambulatory Visit (INDEPENDENT_AMBULATORY_CARE_PROVIDER_SITE_OTHER): Payer: BC Managed Care – PPO | Admitting: Endocrinology

## 2019-07-14 VITALS — Ht 66.0 in | Wt 135.0 lb

## 2019-07-14 DIAGNOSIS — D352 Benign neoplasm of pituitary gland: Secondary | ICD-10-CM

## 2019-07-14 NOTE — Progress Notes (Signed)
Patient ID: Michele Foster, female   DOB: 1991/01/31, 28 y.o.   MRN: [id number]             Referring PCP: Daleen Bo office visit was provided via telemedicine using video technique The patient was explained the limitations of evaluation and management by telemedicine and the availability of in person appointments.  The patient understood the limitations and agreed to proceed. Patient also understood that the telehealth visit is billable. . Location of the patient: Patient's home . Location of the provider: Physician office Only the patient and myself were participating in the encounter    Chief complaint: Follow-up of high prolactin  History of Present Illness  Prior history: She was apparently evaluated for milky breast discharge when she was in high school along with delayed or missed menstrual cycles. She was told that her prolactin level was high and she also had a 5 mm pituitary microadenoma on MRI She was treated with unknown medication which she thinks she took for less than a year No further details are available   Because of her high prolactin level of 38 in 12/2018 she was started on cabergoline 0.25 mg twice daily Previously had an IUD about 5 years ago but this was removed in March 2020 Since then she has had regular menstrual cycles every month  She does not complain of any  milky breast discharge present spontaneously at this time  Although she did not complain of any dizziness on her last visit from cabergoline and this has resolved she says that periodically she will feel dizzy for a few seconds which is more of a sensation of her head spinning around associated with nausea  Also in early July she had been unwell for other reasons and did not take her cabergoline However recently has been taking it regularly  On her initial consultation pituitary function was evaluated and was normal  Prolactin level is now back to normal consistently at 12.5  Prolactin  levels:  Lab Results  Component Value Date   PROLACTIN 12.5 07/11/2019   PROLACTIN 6.0 02/12/2019   PROLACTIN 37.7 (H) 12/09/2018     MRI of pituitary gland done in 09/2009 shows  Several images suggest a 5 mm T2 hypointense, hypoenhancing lesion in the left aspect of the sella which is suspicious for a pituitary microadenoma  No recent weight change:   Wt Readings from Last 3 Encounters:  07/14/19 135 lb (61.2 kg)  02/19/19 135 lb (61.2 kg)  01/16/19 138 lb 6 oz (62.8 kg)     Allergies as of 07/14/2019      Reactions   Tramadol Nausea And Vomiting   Ibuprofen Other (See Comments)   Intolerance.  Gastric upset   Gabapentin       Medication List       Accurate as of July 14, 2019  4:16 PM. If you have any questions, ask your nurse or doctor.        STOP taking these medications   fluvoxaMINE 50 MG tablet Commonly known as: LUVOX Stopped by: Elayne Snare, MD   methocarbamol 500 MG tablet Commonly known as: ROBAXIN Stopped by: Elayne Snare, MD     TAKE these medications   amphetamine-dextroamphetamine 20 MG tablet Commonly known as: ADDERALL Take 1 tablet (20 mg total) by mouth 2 (two) times daily with a meal.   cabergoline 0.5 MG tablet Commonly known as: DOSTINEX Take 1/2 tablet twice weekly   clonazePAM 0.5 MG disintegrating tablet Commonly known as:  KLONOPIN Take 1 tablet (0.5 mg total) by mouth 2 (two) times daily as needed (anxiaty).   pantoprazole 40 MG tablet Commonly known as: PROTONIX Take 1 tablet (40 mg total) by mouth daily.   zolpidem 10 MG tablet Commonly known as: AMBIEN Take 1 tablet (10 mg total) by mouth at bedtime as needed for sleep.       Allergies:  Allergies  Allergen Reactions  . Tramadol Nausea And Vomiting  . Ibuprofen Other (See Comments)    Intolerance.  Gastric upset   . Gabapentin     Past Medical History:  Diagnosis Date  . Chicken pox   . Depression   . GERD (gastroesophageal reflux disease)   .  Hyperlipidemia   . Migraine     No past surgical history on file.  Family History  Problem Relation Age of Onset  . Arthritis Mother   . Depression Mother   . Hearing loss Mother   . Mental illness Mother   . Hyperlipidemia Father   . Hypertension Father   . Depression Sister   . Mental illness Sister   . Arthritis Maternal Grandmother   . Cancer Maternal Grandmother   . Kidney disease Maternal Grandmother   . Hearing loss Maternal Grandfather   . Stroke Maternal Grandfather   . Depression Paternal Grandfather   . Depression Sister   . Hyperlipidemia Sister   . Mental illness Sister     Social History:  reports that she has never smoked. She has never used smokeless tobacco. She reports previous alcohol use. She reports that she does not use drugs.   Review of Systems  She has dizzy spells as above, has not discussed with PCP  Physical Exam  Ht 5\' 6"  (1.676 m)   Wt 135 lb (61.2 kg)   BMI 21.79 kg/m    ASSESSMENT:     5 mm prolactinoma with baseline prolactin level of about 38 and mostly symptoms of galactorrhea This was diagnosed at least 10 years ago  Currently with cabergoline 0.25 mg twice weekly her prolactin level is down to 6 Currently she is not having any galactorrhea Also has regular menstrual cycles  DIZZINESS: Likely to be benign positional vertigo and likely unrelated to cabergoline which she had taken without any problems previously    PLAN:     Continue half tablet twice a week of the cabergoline Discussed that unless her prolactin is low normal she should continue cabergoline regularly  Follow-up in 6 months unless she has had a new symptom Discuss dizziness with her PCP  Elayne Snare 07/14/19   Note: This office note was prepared with Dragon voice recognition system technology. Any transcriptional errors that result from this process are unintentional.

## 2019-07-16 ENCOUNTER — Other Ambulatory Visit: Payer: Self-pay | Admitting: Endocrinology

## 2019-07-16 ENCOUNTER — Ambulatory Visit: Payer: BLUE CROSS/BLUE SHIELD | Admitting: Psychology

## 2019-07-31 DIAGNOSIS — Z20828 Contact with and (suspected) exposure to other viral communicable diseases: Secondary | ICD-10-CM | POA: Diagnosis not present

## 2019-08-06 DIAGNOSIS — R42 Dizziness and giddiness: Secondary | ICD-10-CM | POA: Diagnosis not present

## 2019-08-06 DIAGNOSIS — Z13220 Encounter for screening for lipoid disorders: Secondary | ICD-10-CM | POA: Diagnosis not present

## 2019-08-06 DIAGNOSIS — E559 Vitamin D deficiency, unspecified: Secondary | ICD-10-CM | POA: Diagnosis not present

## 2019-08-06 DIAGNOSIS — Z131 Encounter for screening for diabetes mellitus: Secondary | ICD-10-CM | POA: Diagnosis not present

## 2019-08-06 DIAGNOSIS — L819 Disorder of pigmentation, unspecified: Secondary | ICD-10-CM | POA: Diagnosis not present

## 2019-08-26 ENCOUNTER — Telehealth (HOSPITAL_COMMUNITY): Payer: Self-pay

## 2019-08-26 ENCOUNTER — Other Ambulatory Visit (HOSPITAL_COMMUNITY): Payer: Self-pay | Admitting: Psychiatry

## 2019-08-26 MED ORDER — ZOLPIDEM TARTRATE ER 12.5 MG PO TBCR: 12.5000 mg | EXTENDED_RELEASE_TABLET | Freq: Every evening | ORAL | 0 refills | Status: DC | PRN

## 2019-08-26 NOTE — Telephone Encounter (Signed)
Patient is calling to report that the Ambien is not keeping her asleep. Patient states that she can go to sleep, but it is not a restful sleep and she is still tired during the  Day. She has tried [place] and that did not work, she other sleep aides have her very groggy in the morning and Ambien does not. Please review and advise, thank you

## 2019-08-26 NOTE — Telephone Encounter (Signed)
Let us try Ambien CR 12.5 mg - I ordered one month only so far.

## 2019-08-27 ENCOUNTER — Other Ambulatory Visit (HOSPITAL_COMMUNITY): Payer: Self-pay

## 2019-09-02 ENCOUNTER — Other Ambulatory Visit (HOSPITAL_COMMUNITY): Payer: Self-pay | Admitting: Psychiatry

## 2019-09-02 ENCOUNTER — Telehealth (HOSPITAL_COMMUNITY): Payer: Self-pay

## 2019-09-02 MED ORDER — AMPHETAMINE-DEXTROAMPHETAMINE 20 MG PO TABS: 20.0000 mg | ORAL_TABLET | Freq: Two times a day (BID) | ORAL | 0 refills | Status: DC

## 2019-09-02 NOTE — Telephone Encounter (Signed)
Patient is calling for a refill on her Adderall. She uses Paediatric nurse in [place]

## 2019-09-02 NOTE — Telephone Encounter (Signed)
Done

## 2019-09-11 ENCOUNTER — Encounter (HOSPITAL_COMMUNITY): Payer: Self-pay | Admitting: Psychiatry

## 2019-09-11 ENCOUNTER — Ambulatory Visit (INDEPENDENT_AMBULATORY_CARE_PROVIDER_SITE_OTHER): Payer: BC Managed Care – PPO | Admitting: Psychiatry

## 2019-09-11 ENCOUNTER — Other Ambulatory Visit: Payer: Self-pay

## 2019-09-11 DIAGNOSIS — G4700 Insomnia, unspecified: Secondary | ICD-10-CM

## 2019-09-11 DIAGNOSIS — F422 Mixed obsessional thoughts and acts: Secondary | ICD-10-CM

## 2019-09-11 DIAGNOSIS — F909 Attention-deficit hyperactivity disorder, unspecified type: Secondary | ICD-10-CM

## 2019-09-11 DIAGNOSIS — F3341 Major depressive disorder, recurrent, in partial remission: Secondary | ICD-10-CM

## 2019-09-11 MED ORDER — CLONAZEPAM 1 MG PO TABS: 1.0000 mg | ORAL_TABLET | Freq: Every evening | ORAL | 1 refills | Status: DC | PRN

## 2019-09-11 MED ORDER — FLUVOXAMINE MALEATE 50 MG PO TABS: 50.0000 mg | ORAL_TABLET | Freq: Every day | ORAL | 2 refills | Status: DC

## 2019-09-11 NOTE — Progress Notes (Signed)
[facility] MD/PA/NP OP Progress Note  09/11/2019 1:30 PM Michele Foster  MRN:  [id number] Interview was conducted by phone and I verified that I was speaking with the correct person using two identifiers. I discussed the limitations of evaluation and management by telemedicine and  the availability of in person appointments. Patient expressed understanding and agreed to proceed.  Chief Complaint: Insomnia.  HPI: 28 yo divorced female with constellation of sx suggestive of MDD, OCD, panic disorder and ADHD.Sleep remains poor, anxiety much lower, depression practically in remission, OCD well controlled - on fluvoxamine. Adderall 30 mg bid is the right dose - she sometimes does not need the afternoon dose (depending on workload). No SI, no psychosis.Ambien regular did not keep her asleep but for a few hours, CR is not much better and takes more than an hours to start working.            Visit Diagnosis:    ICD-10-CM   1. Adult ADHD  F90.9   2. Mixed obsessional thoughts and acts  F42.2   3. Major depressive disorder, recurrent, in partial remission (HCC)  F33.41     Past Psychiatric History: Please see intake H&P.  Past Medical History:  Past Medical History:  Diagnosis Date  . Chicken pox   . Depression   . GERD (gastroesophageal reflux disease)   . Hyperlipidemia   . Migraine    History reviewed. No pertinent surgical history.  Family Psychiatric History: Reviewed.  Family History:  Family History  Problem Relation Age of Onset  . Arthritis Mother   . Depression Mother   . Hearing loss Mother   . Mental illness Mother   . Hyperlipidemia Father   . Hypertension Father   . Depression Sister   . Mental illness Sister   . Arthritis Maternal Grandmother   . Cancer Maternal Grandmother   . Kidney disease Maternal Grandmother   . Hearing loss Maternal Grandfather   . Stroke Maternal Grandfather   . Depression Paternal Grandfather   . Depression Sister   . Hyperlipidemia Sister   .  Mental illness Sister     Social History:  Social History   Socioeconomic History  . Marital status: Divorced    Spouse name: Not on file  . Number of children: Not on file  . Years of education: Not on file  . Highest education level: Not on file  Occupational History  . Not on file  Social Needs  . Financial resource strain: Not on file  . Food insecurity    Worry: Not on file    Inability: Not on file  . Transportation needs    Medical: Not on file    Non-medical: Not on file  Tobacco Use  . Smoking status: Never Smoker  . Smokeless tobacco: Never Used  Substance and Sexual Activity  . Alcohol use: Not Currently  . Drug use: Never  . Sexual activity: Yes  Lifestyle  . Physical activity    Days per week: Not on file    Minutes per session: Not on file  . Stress: Not on file  Relationships  . Social Herbalist on phone: Not on file    Gets together: Not on file    Attends religious service: Not on file    Active member of club or organization: Not on file    Attends meetings of clubs or organizations: Not on file    Relationship status: Not on file  Other Topics Concern  .  Not on file  Social History Narrative  . Not on file    Allergies:  Allergies  Allergen Reactions  . Tramadol Nausea And Vomiting  . Ibuprofen Other (See Comments)    Intolerance.  Gastric upset   . Gabapentin     Metabolic Disorder Labs: No results found for: HGBA1C, MPG Lab Results  Component Value Date   PROLACTIN 12.5 07/11/2019   PROLACTIN 6.0 02/12/2019   No results found for: CHOL, TRIG, HDL, CHOLHDL, VLDL, LDLCALC Lab Results  Component Value Date   TSH 1.63 12/09/2018    Therapeutic Level Labs: No results found for: LITHIUM No results found for: VALPROATE No components found for:  CBMZ  Current Medications: Current Outpatient Medications  Medication Sig Dispense Refill  . fluvoxaMINE (LUVOX) 50 MG tablet Take 1 tablet (50 mg total) by mouth at  bedtime. 30 tablet 2  . amphetamine-dextroamphetamine (ADDERALL) 20 MG tablet Take 1 tablet (20 mg total) by mouth 2 (two) times daily with a meal. 60 tablet 0  . cabergoline (DOSTINEX) 0.5 MG tablet TAKE 1/2 TABLET TWICE WEEKLY 8 tablet 2  . clonazePAM (KLONOPIN) 1 MG tablet Take 1 tablet (1 mg total) by mouth at bedtime as needed for anxiety (sleep). 30 tablet 1  . pantoprazole (PROTONIX) 40 MG tablet Take 1 tablet (40 mg total) by mouth daily. 90 tablet 1  . zolpidem (AMBIEN CR) 12.5 MG CR tablet Take 1 tablet (12.5 mg total) by mouth at bedtime as needed for sleep. 30 tablet 0   No current facility-administered medications for this visit.      Psychiatric Specialty Exam: Review of Systems  Psychiatric/Behavioral: The patient has insomnia.   All other systems reviewed and are negative.   There were no vitals taken for this visit.There is no height or weight on file to calculate BMI.  General Appearance: NA  Eye Contact:  NA  Speech:  Clear and Coherent and Normal Rate  Volume:  Normal  Mood:  Euthymic  Affect:  NA  Thought Process:  Goal Directed and Linear  Orientation:  Full (Time, Place, and Person)  Thought Content: Logical   Suicidal Thoughts:  No  Homicidal Thoughts:  No  Memory:  Immediate;   Good Recent;   Good Remote;   Good  Judgement:  Good  Insight:  Good  Psychomotor Activity:  NA  Concentration:  Concentration: Good  Recall:  Good  Fund of Knowledge: Good  Language: Good  Akathisia:  Negative  Handed:  Right  AIMS (if indicated): not done  Assets:  Communication Skills Desire for Improvement Financial Resources/Insurance Housing Physical Health Talents/Skills Vocational/Educational  ADL's:  Intact  Cognition: WNL  Sleep:  Poor   Screenings: GAD-7     Office Visit from 12/09/2018 in [facility]  Total GAD-7 Score  18    PHQ2-9     Office Visit from 01/16/2019 in [facility] Office Visit from 12/09/2018 in  [facility]  PHQ-2 Total Score  2  4  PHQ-9 Total Score  14  20       Assessment and Plan: 28 yo divorced female with constellation of sx suggestive of MDD, OCD, panic disorder and ADHD.Sleep remains poor, anxiety much lower, depression practically in remission, OCD well controlled - on fluvoxamine. Adderall 30 mg bid is the right dose - she sometimes does not need the afternoon dose (depending on workload). No SI, no psychosis.Ambien regular did not keep her asleep but for a  few hours, CR is not much better and takes more than an hours to start working.            Dx: Adult ADHD; OCD; MDD recurrent in remission   Plan: We will continue Luvox 50 mg at HS, Addrall 30 mg bid and increase clonazepam to 1 mg to be used prn sleep. If this does not work we will try Belsomra 15-20 mg. Return to clinic in 6 weeks. The plan was discussed with patient who had an opportunity to ask questions and these were all answered. I spend 25 minutes in phone consultation with the patient.     Stephanie Acre, MD 09/11/2019, 1:30 PM

## 2019-10-20 ENCOUNTER — Telehealth (HOSPITAL_COMMUNITY): Payer: Self-pay | Admitting: *Deleted

## 2019-10-20 NOTE — Telephone Encounter (Signed)
Pt is having wisdom teeth extracted and says dentist asked her to call regarding medication for anxiety. Pt currently taking klonopin 1mg  qhs prn. Please review and advise.

## 2019-10-20 NOTE — Telephone Encounter (Signed)
HIPPA compliant message left for pt regarding dental procedure medication.

## 2019-10-20 NOTE — Telephone Encounter (Signed)
I think she should take extra clonazepam dose (she may take two tablets if she will have that procedure under local anesthesia only) between 1 and 2 hours before procedure.

## 2019-10-20 NOTE — Telephone Encounter (Signed)
I am not sure what is that she or her dentist are asking for? She is on clonazepam, as you noted, and the doe was just increased? Is it that they want her to have something extra prior to tooth extraction?

## 2019-10-20 NOTE — Telephone Encounter (Signed)
Correct

## 2019-10-21 ENCOUNTER — Telehealth (HOSPITAL_COMMUNITY): Payer: Self-pay | Admitting: *Deleted

## 2019-10-21 ENCOUNTER — Other Ambulatory Visit: Payer: Self-pay

## 2019-10-21 ENCOUNTER — Ambulatory Visit (INDEPENDENT_AMBULATORY_CARE_PROVIDER_SITE_OTHER): Payer: BC Managed Care – PPO | Admitting: Psychiatry

## 2019-10-21 DIAGNOSIS — F3341 Major depressive disorder, recurrent, in partial remission: Secondary | ICD-10-CM

## 2019-10-21 DIAGNOSIS — F422 Mixed obsessional thoughts and acts: Secondary | ICD-10-CM

## 2019-10-21 DIAGNOSIS — F909 Attention-deficit hyperactivity disorder, unspecified type: Secondary | ICD-10-CM

## 2019-10-21 MED ORDER — CLONAZEPAM 1 MG PO TABS: 1.0000 mg | ORAL_TABLET | Freq: Every evening | ORAL | 2 refills | Status: DC | PRN

## 2019-10-21 MED ORDER — ZOLPIDEM TARTRATE 10 MG PO TABS: 10.0000 mg | ORAL_TABLET | Freq: Every evening | ORAL | 2 refills | Status: DC | PRN

## 2019-10-21 MED ORDER — AMPHETAMINE-DEXTROAMPHETAMINE 20 MG PO TABS: 20.0000 mg | ORAL_TABLET | Freq: Two times a day (BID) | ORAL | 0 refills | Status: DC

## 2019-10-21 NOTE — Progress Notes (Signed)
[facility] MD/PA/NP OP Progress Note  10/21/2019 2:48 PM Kelseigh Stephenson  MRN:  [id number] Interview was conducted by phone and I verified that I was speaking with the correct person using two identifiers. I discussed the limitations of evaluation and management by telemedicine and  the availability of in person appointments. Patient expressed understanding and agreed to proceed.  Chief Complaint: Anxiety.  HPI: 28 yo divorced female with constellation of sx suggestive of MDD, OCD, panic disorder and ADHD.Sleepremains poor, anxiety much lower, depression practically inremission,OCD well controlled - onfluvoxamine.Adderall 30 mgbid is the right dose - she sometimes does not need the afternoon dose (depending on workload). No SI, no psychosis.Ambien regular did not keep her asleep but for a few hours, CR was even worse. She takes clonazepam 1 mg with good effect but after few days alternates it with regular Ambien. She tried [place] with no benefit earlier. Tamatha will have 4 teeth extracted and is anxious about the procedure. I suggested taking a double 2 mg dose of clonazepam 2 hours before the procedure..   Visit Diagnosis:    ICD-10-CM   1. Mixed obsessional thoughts and acts  F42.2   2. Adult ADHD  F90.9   3. Major depressive disorder, recurrent, in partial remission (HCC)  F33.41     Past Psychiatric History: Please see intake H&P.  Past Medical History:  Past Medical History:  Diagnosis Date  . Chicken pox   . Depression   . GERD (gastroesophageal reflux disease)   . Hyperlipidemia   . Migraine    No past surgical history on file.  Family Psychiatric History: Reviewed.  Family History:  Family History  Problem Relation Age of Onset  . Arthritis Mother   . Depression Mother   . Hearing loss Mother   . Mental illness Mother   . Hyperlipidemia Father   . Hypertension Father   . Depression Sister   . Mental illness Sister   . Arthritis Maternal Grandmother   . Cancer  Maternal Grandmother   . Kidney disease Maternal Grandmother   . Hearing loss Maternal Grandfather   . Stroke Maternal Grandfather   . Depression Paternal Grandfather   . Depression Sister   . Hyperlipidemia Sister   . Mental illness Sister     Social History:  Social History   Socioeconomic History  . Marital status: Divorced    Spouse name: Not on file  . Number of children: Not on file  . Years of education: Not on file  . Highest education level: Not on file  Occupational History  . Not on file  Social Needs  . Financial resource strain: Not on file  . Food insecurity    Worry: Not on file    Inability: Not on file  . Transportation needs    Medical: Not on file    Non-medical: Not on file  Tobacco Use  . Smoking status: Never Smoker  . Smokeless tobacco: Never Used  Substance and Sexual Activity  . Alcohol use: Not Currently  . Drug use: Never  . Sexual activity: Yes  Lifestyle  . Physical activity    Days per week: Not on file    Minutes per session: Not on file  . Stress: Not on file  Relationships  . Social Herbalist on phone: Not on file    Gets together: Not on file    Attends religious service: Not on file    Active member of club or organization: Not on  file    Attends meetings of clubs or organizations: Not on file    Relationship status: Not on file  Other Topics Concern  . Not on file  Social History Narrative  . Not on file    Allergies:  Allergies  Allergen Reactions  . Tramadol Nausea And Vomiting  . Ibuprofen Other (See Comments)    Intolerance.  Gastric upset   . Gabapentin     Metabolic Disorder Labs: No results found for: HGBA1C, MPG Lab Results  Component Value Date   PROLACTIN 12.5 07/11/2019   PROLACTIN 6.0 02/12/2019   No results found for: CHOL, TRIG, HDL, CHOLHDL, VLDL, LDLCALC Lab Results  Component Value Date   TSH 1.63 12/09/2018    Therapeutic Level Labs: No results found for: LITHIUM No results  found for: VALPROATE No components found for:  CBMZ  Current Medications: Current Outpatient Medications  Medication Sig Dispense Refill  . amphetamine-dextroamphetamine (ADDERALL) 20 MG tablet Take 1 tablet (20 mg total) by mouth 2 (two) times daily with a meal. 60 tablet 0  . cabergoline (DOSTINEX) 0.5 MG tablet TAKE 1/2 TABLET TWICE WEEKLY 8 tablet 2  . clonazePAM (KLONOPIN) 1 MG tablet Take 1 tablet (1 mg total) by mouth at bedtime as needed for anxiety (sleep). 30 tablet 2  . fluvoxaMINE (LUVOX) 50 MG tablet Take 1 tablet (50 mg total) by mouth at bedtime. 30 tablet 2  . pantoprazole (PROTONIX) 40 MG tablet Take 1 tablet (40 mg total) by mouth daily. 90 tablet 1  . zolpidem (AMBIEN) 10 MG tablet Take 1 tablet (10 mg total) by mouth at bedtime as needed for sleep. 30 tablet 2   No current facility-administered medications for this visit.       Psychiatric Specialty Exam: Review of Systems  Psychiatric/Behavioral: The patient is nervous/anxious and has insomnia.   All other systems reviewed and are negative.   There were no vitals taken for this visit.There is no height or weight on file to calculate BMI.  General Appearance: NA  Eye Contact:  NA  Speech:  Clear and Coherent and Normal Rate  Volume:  Normal  Mood:  Anxious  Affect:  NA  Thought Process:  Goal Directed and Linear  Orientation:  Full (Time, Place, and Person)  Thought Content: Logical   Suicidal Thoughts:  No  Homicidal Thoughts:  No  Memory:  Immediate;   Good Recent;   Good Remote;   Good  Judgement:  Good  Insight:  Good  Psychomotor Activity:  NA  Concentration:  Concentration: Good  Recall:  Good  Fund of Knowledge: Good  Language: Good  Akathisia:  Negative  Handed:  Right  AIMS (if indicated): not done  Assets:  Communication Skills Desire for Improvement Financial Resources/Insurance Housing Physical Health Talents/Skills Transportation  ADL's:  Intact  Cognition: WNL  Sleep:  Fair    Screenings: GAD-7     Office Visit from 12/09/2018 in [facility]  Total GAD-7 Score  18    PHQ2-9     Office Visit from 01/16/2019 in [facility] Visit from 12/09/2018 in [facility]  PHQ-2 Total Score  2  4  PHQ-9 Total Score  14  20       Assessment and Plan: 28 yo divorced female with constellation of sx suggestive of MDD, OCD, panic disorder and ADHD.Sleepremains poor, anxiety much lower, depression practically inremission,OCD well controlled - onfluvoxamine.Adderall 30 mgbid is the right dose - she  sometimes does not need the afternoon dose (depending on workload). No SI, no psychosis.Ambien regular did not keep her asleep but for a few hours, CR was even worse. She takes clonazepam 1 mg with good effect but after few days alternates it with regular Ambien. She tried [place] with no benefit earlier. Naiyana will have 4 teeth extracted and is anxious about the procedure. I suggested taking a double 2 mg dose of clonazepam 2 hours before the procedure.Marland Kitchen   Dx: Adult ADHD; OCD; MDD recurrent in remission  Plan:We will continue Luvox 50 mg at HS, Addrall30 mg bid, clonazepamto 1 mg to be used prn anxiety/sleep and zolpidem 10 mg prn sleep to alternate with clonazepam. Return to clinic in 3 months or prn.The plan was discussed with patient who had an opportunity to ask questions and these were all answered. I spend25 minutes inphone consultation with the patient.    Stephanie Acre, MD 10/21/2019, 2:48 PM

## 2019-10-21 NOTE — Telephone Encounter (Signed)
Lovena Le from [facility] called regarding pre procedure medication, Valium, for pt. Writer relayed that pt is on Klonopin 1mg  and that you had advised that pt take two Klonopin 1mg  1-2 hours prior to procedure provided it was local anesthesia only, of which pt is aware.

## 2019-12-09 ENCOUNTER — Other Ambulatory Visit (HOSPITAL_COMMUNITY): Payer: Self-pay | Admitting: Psychiatry

## 2019-12-09 ENCOUNTER — Telehealth (HOSPITAL_COMMUNITY): Payer: Self-pay | Admitting: *Deleted

## 2019-12-09 MED ORDER — AMPHETAMINE-DEXTROAMPHETAMINE 20 MG PO TABS: 20.0000 mg | ORAL_TABLET | Freq: Two times a day (BID) | ORAL | 0 refills | Status: DC

## 2019-12-09 NOTE — Telephone Encounter (Signed)
Done

## 2019-12-09 NOTE — Telephone Encounter (Signed)
Pt left message requesting refill on the Adderall 20mg  bid. Last filled 10/21/19. Pt has an upcoming appointment on 12/22/19. Please review.

## 2019-12-22 ENCOUNTER — Ambulatory Visit (HOSPITAL_COMMUNITY): Payer: BC Managed Care – PPO | Admitting: Psychiatry

## 2019-12-22 ENCOUNTER — Other Ambulatory Visit: Payer: Self-pay

## 2019-12-22 ENCOUNTER — Other Ambulatory Visit (HOSPITAL_COMMUNITY): Payer: Self-pay | Admitting: Psychiatry

## 2019-12-22 MED ORDER — FLUVOXAMINE MALEATE 50 MG PO TABS: 50.0000 mg | ORAL_TABLET | Freq: Every day | ORAL | 2 refills | Status: DC

## 2020-01-08 DIAGNOSIS — J029 Acute pharyngitis, unspecified: Secondary | ICD-10-CM | POA: Diagnosis not present

## 2020-01-08 DIAGNOSIS — R519 Headache, unspecified: Secondary | ICD-10-CM | POA: Diagnosis not present

## 2020-01-08 DIAGNOSIS — Z20828 Contact with and (suspected) exposure to other viral communicable diseases: Secondary | ICD-10-CM | POA: Diagnosis not present

## 2020-01-25 IMAGING — NM NUCLEAR MEDICINE GASTRIC EMPTYING STUDY
5 series · 5 of 5 positions shown · non-contrast
Comparison: None

CLINICAL DATA: Nausea and abdominal pain for 2 months, not diabetic

EXAM:
NUCLEAR MEDICINE GASTRIC EMPTYING SCAN
TECHNIQUE: After oral ingestion of radiolabeled meal, sequential abdominal
images were obtained for 4 hours. Percentage of activity emptying
the stomach was calculated at 1 hour, 2 hour, 3 hour, and 4 hours.
RADIOPHARMACEUTICALS:  1.9 mCi Yc-IIm sulfur colloid in standardized
meal

[Series 1: 0 min · 4.14mm/px · 1 of 1 slices shown]
[im 1/1]
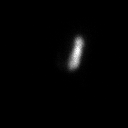

[Series 2: 1 hr · 4.14mm/px · 1 of 1 slices shown]
[im 1/1]
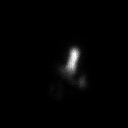

[Series 3: 2 hr · 4.14mm/px · 1 of 1 slices shown]
[im 1/1]
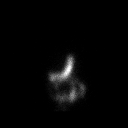

[Series 4: 3 hr · 4.14mm/px · 1 of 1 slices shown]
[im 1/1]
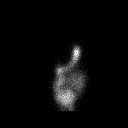

[Series 6: 4 hr · 4.14mm/px · 1 of 1 slices shown]
[im 1/1]
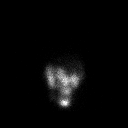

[5 of 5 positions shown; findings below may reference images not displayed]

FINDINGS: Expected location of the stomach in the left upper quadrant.

Ingested meal empties the stomach gradually over the course of the
study.

12% emptied at 1 hr ( normal >= 10%)

38% emptied at 2 hr ( normal >= 40%)

68% emptied at 3 hr ( normal >= 70%)

92% emptied at 4 hr ( normal >= 90%)
IMPRESSION: Normal gastric emptying study.

## 2020-01-26 ENCOUNTER — Ambulatory Visit: Payer: BC Managed Care – PPO | Admitting: Endocrinology

## 2020-01-26 DIAGNOSIS — Z0289 Encounter for other administrative examinations: Secondary | ICD-10-CM

## 2020-01-29 ENCOUNTER — Other Ambulatory Visit: Payer: Self-pay

## 2020-01-29 ENCOUNTER — Ambulatory Visit (INDEPENDENT_AMBULATORY_CARE_PROVIDER_SITE_OTHER): Payer: BC Managed Care – PPO | Admitting: Psychiatry

## 2020-01-29 DIAGNOSIS — F3341 Major depressive disorder, recurrent, in partial remission: Secondary | ICD-10-CM

## 2020-01-29 DIAGNOSIS — F909 Attention-deficit hyperactivity disorder, unspecified type: Secondary | ICD-10-CM | POA: Diagnosis not present

## 2020-01-29 DIAGNOSIS — F422 Mixed obsessional thoughts and acts: Secondary | ICD-10-CM | POA: Diagnosis not present

## 2020-01-29 MED ORDER — AMPHETAMINE-DEXTROAMPHETAMINE 20 MG PO TABS: 20.0000 mg | ORAL_TABLET | Freq: Two times a day (BID) | ORAL | 0 refills | Status: DC

## 2020-01-29 MED ORDER — CLONAZEPAM 1 MG PO TABS: 1.0000 mg | ORAL_TABLET | Freq: Every evening | ORAL | 3 refills | Status: DC | PRN

## 2020-01-29 MED ORDER — FLUVOXAMINE MALEATE 50 MG PO TABS: 50.0000 mg | ORAL_TABLET | Freq: Every day | ORAL | 3 refills | Status: AC

## 2020-01-29 MED ORDER — AMPHETAMINE-DEXTROAMPHETAMINE 20 MG PO TABS: 20.0000 mg | ORAL_TABLET | Freq: Two times a day (BID) | ORAL | 0 refills | Status: AC

## 2020-01-29 MED ORDER — ZOLPIDEM TARTRATE 10 MG PO TABS: 10.0000 mg | ORAL_TABLET | Freq: Every evening | ORAL | 3 refills | Status: DC | PRN

## 2020-01-29 NOTE — Progress Notes (Signed)
[facility] MD/PA/NP OP Progress Note  01/29/2020 2:14 PM Michele Foster  MRN:  [id number] Interview was conducted by phone and I verified that I was speaking with the correct person using two identifiers. I discussed the limitations of evaluation and management by telemedicine and  the availability of in person appointments. Patient expressed understanding and agreed to proceed.  Chief Complaint: "I am doing well except for attention problems - run out of Adderall".  HPI: 29yo divorced female with constellation of sx suggestive of MDD, OCD, panic disorder and ADHD.Sleepremainspoor, anxietymuch lower, depression practically inremission,OCD well controlled - onfluvoxamine.Adderall 20 mgbid is the right dose - she sometimes does not need the afternoon dose (depending on workload). No SI, no psychosis.Ambien regular did not keep her asleep but for a few hours, CR was even worse. She takes clonazepam 1 mg with good effect but after few days alternates it with regular Ambien. She tried [place] with no benefit earlier.    Visit Diagnosis:    ICD-10-CM   1. Major depressive disorder, recurrent, in partial remission (HCC)  F33.41   2. Mixed obsessional thoughts and acts  F42.2   3. Adult ADHD  F90.9     Past Psychiatric History: Please see intake H&P.  Past Medical History:  Past Medical History:  Diagnosis Date  . Chicken pox   . Depression   . GERD (gastroesophageal reflux disease)   . Hyperlipidemia   . Migraine    No past surgical history on file.  Family Psychiatric History: Reviewed.  Family History:  Family History  Problem Relation Age of Onset  . Arthritis Mother   . Depression Mother   . Hearing loss Mother   . Mental illness Mother   . Hyperlipidemia Father   . Hypertension Father   . Depression Sister   . Mental illness Sister   . Arthritis Maternal Grandmother   . Cancer Maternal Grandmother   . Kidney disease Maternal Grandmother   . Hearing loss Maternal  Grandfather   . Stroke Maternal Grandfather   . Depression Paternal Grandfather   . Depression Sister   . Hyperlipidemia Sister   . Mental illness Sister     Social History:  Social History   Socioeconomic History  . Marital status: Divorced    Spouse name: Not on file  . Number of children: Not on file  . Years of education: Not on file  . Highest education level: Not on file  Occupational History  . Not on file  Tobacco Use  . Smoking status: Never Smoker  . Smokeless tobacco: Never Used  Substance and Sexual Activity  . Alcohol use: Not Currently  . Drug use: Never  . Sexual activity: Yes  Other Topics Concern  . Not on file  Social History Narrative  . Not on file   Social Determinants of Health   Financial Resource Strain:   . Difficulty of Paying Living Expenses: Not on file  Food Insecurity:   . Worried About Charity fundraiser in the Last Year: Not on file  . Ran Out of Food in the Last Year: Not on file  Transportation Needs:   . Lack of Transportation (Medical): Not on file  . Lack of Transportation (Non-Medical): Not on file  Physical Activity:   . Days of Exercise per Week: Not on file  . Minutes of Exercise per Session: Not on file  Stress:   . Feeling of Stress : Not on file  Social Connections:   . Frequency  of Communication with Friends and Family: Not on file  . Frequency of Social Gatherings with Friends and Family: Not on file  . Attends Religious Services: Not on file  . Active Member of Clubs or Organizations: Not on file  . Attends Archivist Meetings: Not on file  . Marital Status: Not on file    Allergies:  Allergies  Allergen Reactions  . Tramadol Nausea And Vomiting  . Ibuprofen Other (See Comments)    Intolerance.  Gastric upset   . Gabapentin     Metabolic Disorder Labs: No results found for: HGBA1C, MPG Lab Results  Component Value Date   PROLACTIN 12.5 07/11/2019   PROLACTIN 6.0 02/12/2019   No results  found for: CHOL, TRIG, HDL, CHOLHDL, VLDL, LDLCALC Lab Results  Component Value Date   TSH 1.63 12/09/2018    Therapeutic Level Labs: No results found for: LITHIUM No results found for: VALPROATE No components found for:  CBMZ  Current Medications: Current Outpatient Medications  Medication Sig Dispense Refill  . amphetamine-dextroamphetamine (ADDERALL) 20 MG tablet Take 1 tablet (20 mg total) by mouth 2 (two) times daily. 60 tablet 0  . [START ON 02/28/2020] amphetamine-dextroamphetamine (ADDERALL) 20 MG tablet Take 1 tablet (20 mg total) by mouth 2 (two) times daily. 60 tablet 0  . [START ON 03/29/2020] amphetamine-dextroamphetamine (ADDERALL) 20 MG tablet Take 1 tablet (20 mg total) by mouth 2 (two) times daily. 60 tablet 0  . cabergoline (DOSTINEX) 0.5 MG tablet TAKE 1/2 TABLET TWICE WEEKLY 8 tablet 2  . clonazePAM (KLONOPIN) 1 MG tablet Take 1 tablet (1 mg total) by mouth at bedtime as needed for anxiety (sleep). 30 tablet 3  . fluvoxaMINE (LUVOX) 50 MG tablet Take 1 tablet (50 mg total) by mouth at bedtime. 30 tablet 3  . pantoprazole (PROTONIX) 40 MG tablet Take 1 tablet (40 mg total) by mouth daily. 90 tablet 1  . zolpidem (AMBIEN) 10 MG tablet Take 1 tablet (10 mg total) by mouth at bedtime as needed for sleep. 30 tablet 3   No current facility-administered medications for this visit.     Psychiatric Specialty Exam: Review of Systems  Psychiatric/Behavioral: Positive for decreased concentration and sleep disturbance.  All other systems reviewed and are negative.   There were no vitals taken for this visit.There is no height or weight on file to calculate BMI.  General Appearance: NA  Eye Contact:  NA  Speech:  Clear and Coherent and Normal Rate  Volume:  Normal  Mood:  Euthymic  Affect:  NA  Thought Process:  Goal Directed and Linear  Orientation:  Full (Time, Place, and Person)  Thought Content: Obsessions   Suicidal Thoughts:  No  Homicidal Thoughts:  No  Memory:   Immediate;   Good Recent;   Good Remote;   Good  Judgement:  Good  Insight:  Good  Psychomotor Activity:  NA  Concentration:  Concentration: Fair  Recall:  Good  Fund of Knowledge: Good  Language: Good  Akathisia:  Negative  Handed:  Right  AIMS (if indicated): not done  Assets:  Communication Skills Desire for Improvement Financial Resources/Insurance Housing Physical Health Talents/Skills  ADL's:  Intact  Cognition: WNL  Sleep:  Fair   Screenings: GAD-7     Office Visit from 12/09/2018 in [facility]  Total GAD-7 Score  18    PHQ2-9     Office Visit from 01/16/2019 in [facility] Office Visit from 12/09/2018 in [facility]  PHQ-2 Total Score  2  4  PHQ-9 Total Score  14  20       Assessment and Plan: 29yo divorced female with constellation of sx suggestive of MDD, OCD, panic disorder and ADHD.Sleepremainspoor, anxietymuch lower, depression practically inremission,OCD well controlled - onfluvoxamine.Adderall 20 mgbid is the right dose - she sometimes does not need the afternoon dose (depending on workload). No SI, no psychosis.Ambien regular did not keep her asleep but for a few hours, CR was even worse. She takes clonazepam 1 mg with good effect but after few days alternates it with regular Ambien. She tried [place] with no benefit earlier.  Michele Foster has run out of Adderall two weeks ago (some issues with her insurance) and her problems with attention markedly increased.  Dx: Adult ADHD; OCD; MDD recurrent in remission  Plan:We will continue Luvox 50 mg at HS, Adderall20 mg bid, clonazepamto 1 mg to be used prn anxiety/sleep and zolpidem 10 mg prn sleep to alternate with clonazepam.Return to clinic in3 months or prn.The plan was discussed with patient who had an opportunity to ask questions and these were all answered. I spend20 minutes inphone consultation with the  patient.    Stephanie Acre, MD 01/29/2020, 2:14 PM

## 2020-02-20 DIAGNOSIS — Z6821 Body mass index (BMI) 21.0-21.9, adult: Secondary | ICD-10-CM | POA: Diagnosis not present

## 2020-02-20 DIAGNOSIS — Z113 Encounter for screening for infections with a predominantly sexual mode of transmission: Secondary | ICD-10-CM | POA: Diagnosis not present

## 2020-02-20 DIAGNOSIS — Z01419 Encounter for gynecological examination (general) (routine) without abnormal findings: Secondary | ICD-10-CM | POA: Diagnosis not present

## 2020-03-18 DIAGNOSIS — N921 Excessive and frequent menstruation with irregular cycle: Secondary | ICD-10-CM | POA: Diagnosis not present

## 2020-03-18 DIAGNOSIS — Z6821 Body mass index (BMI) 21.0-21.9, adult: Secondary | ICD-10-CM | POA: Diagnosis not present

## 2020-03-18 DIAGNOSIS — R350 Frequency of micturition: Secondary | ICD-10-CM | POA: Diagnosis not present

## 2020-04-13 ENCOUNTER — Other Ambulatory Visit (HOSPITAL_COMMUNITY): Payer: Self-pay | Admitting: Psychiatry

## 2020-04-13 MED ORDER — AMPHETAMINE-DEXTROAMPHETAMINE 20 MG PO TABS: 20.0000 mg | ORAL_TABLET | Freq: Two times a day (BID) | ORAL | 0 refills | Status: AC

## 2020-04-13 MED ORDER — ZOLPIDEM TARTRATE 10 MG PO TABS: 10.0000 mg | ORAL_TABLET | Freq: Every evening | ORAL | 3 refills | Status: AC | PRN

## 2020-04-13 MED ORDER — CLONAZEPAM 1 MG PO TABS: 1.0000 mg | ORAL_TABLET | Freq: Every evening | ORAL | 3 refills | Status: AC | PRN

## 2020-05-13 ENCOUNTER — Other Ambulatory Visit: Payer: Self-pay

## 2020-05-13 ENCOUNTER — Telehealth (HOSPITAL_COMMUNITY): Payer: BC Managed Care – PPO | Admitting: Psychiatry

## 2020-05-13 ENCOUNTER — Other Ambulatory Visit (HOSPITAL_COMMUNITY): Payer: Self-pay | Admitting: Psychiatry

## 2020-05-13 MED ORDER — AMPHETAMINE-DEXTROAMPHETAMINE 20 MG PO TABS: 20.0000 mg | ORAL_TABLET | Freq: Two times a day (BID) | ORAL | 0 refills | Status: AC
